# Patient Record
Sex: Female | Born: 1980 | Race: Black or African American | Hispanic: No | Marital: Single | Smoking: Former smoker
Health system: Southern US, Community
[De-identification: ages and names within clinical notes are randomized; demographics above are authoritative.]

## PROBLEM LIST (undated history)

## (undated) DIAGNOSIS — M1990 Unspecified osteoarthritis, unspecified site: Secondary | ICD-10-CM

## (undated) HISTORY — DX: Unspecified osteoarthritis, unspecified site: M19.90

## (undated) HISTORY — PX: TUBAL LIGATION: SHX77

---

## 2001-09-06 ENCOUNTER — Emergency Department (HOSPITAL_COMMUNITY): Admission: EM | Admit: 2001-09-06 | Discharge: 2001-09-06 | Payer: Self-pay | Admitting: Emergency Medicine

## 2002-08-19 ENCOUNTER — Emergency Department (HOSPITAL_COMMUNITY): Admission: EM | Admit: 2002-08-19 | Discharge: 2002-08-20 | Payer: Self-pay | Admitting: Emergency Medicine

## 2004-04-17 ENCOUNTER — Inpatient Hospital Stay (HOSPITAL_COMMUNITY): Admission: AD | Admit: 2004-04-17 | Discharge: 2004-04-19 | Payer: Self-pay | Admitting: Obstetrics and Gynecology

## 2005-10-22 ENCOUNTER — Emergency Department (HOSPITAL_COMMUNITY): Admission: EM | Admit: 2005-10-22 | Discharge: 2005-10-22 | Payer: Self-pay | Admitting: Emergency Medicine

## 2005-11-09 ENCOUNTER — Ambulatory Visit (HOSPITAL_COMMUNITY): Admission: RE | Admit: 2005-11-09 | Discharge: 2005-11-09 | Payer: Self-pay | Admitting: *Deleted

## 2006-04-02 ENCOUNTER — Inpatient Hospital Stay (HOSPITAL_COMMUNITY): Admission: AD | Admit: 2006-04-02 | Discharge: 2006-04-03 | Payer: Self-pay | Admitting: Obstetrics and Gynecology

## 2007-04-08 ENCOUNTER — Ambulatory Visit: Payer: Self-pay | Admitting: Family Medicine

## 2007-04-08 ENCOUNTER — Inpatient Hospital Stay (HOSPITAL_COMMUNITY): Admission: AD | Admit: 2007-04-08 | Discharge: 2007-04-09 | Payer: Self-pay | Admitting: Obstetrics and Gynecology

## 2007-05-16 ENCOUNTER — Ambulatory Visit (HOSPITAL_COMMUNITY): Admission: RE | Admit: 2007-05-16 | Discharge: 2007-05-16 | Payer: Self-pay | Admitting: Obstetrics & Gynecology

## 2007-10-16 ENCOUNTER — Other Ambulatory Visit: Admission: RE | Admit: 2007-10-16 | Discharge: 2007-10-16 | Payer: Self-pay | Admitting: Obstetrics & Gynecology

## 2008-10-17 ENCOUNTER — Other Ambulatory Visit: Admission: RE | Admit: 2008-10-17 | Discharge: 2008-10-17 | Payer: Self-pay | Admitting: Obstetrics & Gynecology

## 2010-04-13 ENCOUNTER — Other Ambulatory Visit: Admission: RE | Admit: 2010-04-13 | Discharge: 2010-04-13 | Payer: Self-pay | Admitting: Obstetrics & Gynecology

## 2010-09-05 HISTORY — PX: OTHER SURGICAL HISTORY: SHX169

## 2010-12-17 ENCOUNTER — Emergency Department (HOSPITAL_COMMUNITY)
Admission: EM | Admit: 2010-12-17 | Discharge: 2010-12-17 | Disposition: A | Discharge status: Home / Self Care | Payer: Medicaid Other | Attending: Emergency Medicine | Admitting: Emergency Medicine

## 2010-12-17 DIAGNOSIS — T148XXA Other injury of unspecified body region, initial encounter: Secondary | ICD-10-CM | POA: Insufficient documentation

## 2010-12-17 DIAGNOSIS — M545 Low back pain, unspecified: Secondary | ICD-10-CM | POA: Insufficient documentation

## 2010-12-17 DIAGNOSIS — Y929 Unspecified place or not applicable: Secondary | ICD-10-CM | POA: Insufficient documentation

## 2010-12-17 DIAGNOSIS — M25519 Pain in unspecified shoulder: Secondary | ICD-10-CM | POA: Insufficient documentation

## 2011-01-18 NOTE — Consult Note (Signed)
[name], Joy Harrington                  ACCOUNT NO.:  [id number]   MEDICAL RECORD NO.:  [id number]          PATIENT TYPE:  INP   LOCATION:  9170                          FACILITY:  [facility]   PHYSICIAN:  Tanya S. Shawnie Pons, M.D.   DATE OF BIRTH:  [birth year]   DATE OF CONSULTATION:  DATE OF DISCHARGE:                                 CONSULTATION   DELIVERY NOTE:  Immediately after receiving her epidural [name] felt an  urge to push. Sure enough, she was completely  dilated at +2 station.  After about 30 minute second stage [name] had a spontaneous vaginal  delivery of a viable female infant at 11:28.  There was a loose nuchal  cord which is very easily reduced.  The mouth and nose were suctioned  with a bulb syringe and the shoulders delivered without difficulty.  At  this point it was noted that the umbilical cord have avulsed from the  placenta prior to delivery.  There was a large amount of vaginal  bleeding.  However, I cannot be sure that some of it was not coming from  the umbilical cord. The umbilical cord was immediately clamped from the  baby and the baby taken to the warmer.  Weight 7 pounds 6 ounces, Apgars  are 9 and 9.  Twenty units of Pitocin diluted in 1000 mL of lactated  Ringer's was then infused rapidly IV.  The placenta had separated  spontaneously so I reached inside the vagina and grabbed hold of the  edge and it was delivered with maternal pushing effort and some manual  traction.  The placenta had already separated so this was not a manual  removal of the placenta.  The estimated blood loss was 500 mL.  The  placenta was inspected and appears to be intact.  Vagina was inspected  and is also intact.      [name], C.N.M.      Shelbie Proctor. Shawnie Pons, M.D.  Electronically Signed    FC/MEDQ  D:  04/08/2007  T:  04/08/2007  Job:  [id number]   cc:   [facility]   Francoise Schaumann. Milford Cage DO, FAAP  Fax: [phone]

## 2011-01-18 NOTE — Op Note (Signed)
NAMELORRETTA, KERCE                  ACCOUNT NO.:  [id number]   MEDICAL RECORD NO.:  [id number]          PATIENT TYPE:  AMB   LOCATION:  DAY                           FACILITY:  [facility]   PHYSICIAN:  Lazaro Arms, M.D.   DATE OF BIRTH:  22-Dec-1980   DATE OF PROCEDURE:  05/16/2007  DATE OF DISCHARGE:                               OPERATIVE REPORT   PREOPERATIVE DIAGNOSIS:  Multiparous female desires permanent  sterilization.   POSTOPERATIVE DIAGNOSIS:  Multiparous female desires permanent  sterilization.   PROCEDURE:  Laparoscopic bilateral tubal ligation.   SURGEON:  Lazaro Arms, M.D.   ANESTHESIA:  General endotracheal anesthesia.   FINDINGS:  The patient had normal uterus, tubes, and ovaries.  Normal  pelvic evaluation.   DESCRIPTION OF PROCEDURE:  The patient was taken to the operating room  and placed in the supine position where she underwent general  endotracheal anesthesia.  She was prepped and draped in the usual  sterile fashion.  A Hulka tenaculum was placed in the uterus for  manipulation.  An incision was made in the umbilicus.  The Veress needle  was placed.  Saline drop test was appropriate.  The peritoneal cavity  was insufflated.  A non-bladed 11 mm trocar was placed under direct  visualization without difficulty.  The fallopian tubes were identified.  They were burned using the electrocautery unit, a 2.5 cm segment  bilaterally.  There was good hemostasis.  The instruments were removed.  The gas was allowed to escape.  The fascia was closed with a single 0  Vicryl suture.  The skin was closed using skin staples.  The patient  tolerated the procedure well.  She experienced no blood loss.  She was  taken to the recovery room in good, stable condition.  All counts were  correct x3.      Lazaro Arms, M.D.  Electronically Signed     LHE/MEDQ  D:  05/16/2007  T:  05/16/2007  Job:  [id number]

## 2011-01-18 NOTE — H&P (Signed)
NAMECANDIA, KINGSBURY                  ACCOUNT NO.:  [id number]   MEDICAL RECORD NO.:  [id number]          PATIENT TYPE:  INP   LOCATION:  9133                          FACILITY:  [facility]   PHYSICIAN:  Tanya S. Shawnie Pons, M.D.   DATE OF BIRTH:  [birth year]   DATE OF ADMISSION:  04/08/2007  DATE OF DISCHARGE:  LH                              HISTORY & PHYSICAL   CHIEF COMPLAINT:  Spontaneous rupture of membranes at 0500, early labor.   HISTORY OF PRESENT ILLNESS:  French Ana is 30 year old gravida 5, para 2 AB  two living 2 with a EDC of 04/21/2007 placing her at [redacted] weeks gestation.  She began prenatal care in her first trimester and has had regular  visits since then. Lateefa has had an uneventful prenatal course.  Prenatal labs include blood type B+, rubella immune.  HB, SAG, HIV, HSV,  RPR, gonorrhea, Chlamydia and Group B strep are all normal.  1-hour GTT  was normal at 127.  Sickle cell was also negative.   PHYSICAL EXAM:  HEENT: Within normal limits.  Heart: Regular rate and  rhythm.  Lungs were clear.  Abdomen is soft and nontender.  She is  having mild uterine contractions every 3-5 minutes.  She is leaking  moderate amount of clear fluid.  Cervix is 5 cm, 9% effaced, minus one  station, vertex presentation.  Legs have trace edema.  Fetal heart rate  is reactive without V-cells   IMPRESSION:  IUP at term, spontaneous rupture of membranes, early labor.  Group B strep negative.   PLAN:  The patient does plan an epidural.  If her labor does not pick up  we will do Pitocin augmentation.      [name], C.N.M.      Shelbie Proctor. Shawnie Pons, M.D.  Electronically Signed    FC/MEDQ  D:  04/08/2007  T:  04/08/2007  Job:  [id number]   cc:   [facility]   Francoise Schaumann. Milford Cage DO, FAAP  Fax: [phone]

## 2011-01-21 NOTE — Op Note (Signed)
Joy Harrington, Joy Harrington                            ACCOUNT NO.:  [id number]   MEDICAL RECORD NO.:  [id number]                   PATIENT TYPE:  INP   LOCATION:  A428                                 FACILITY:  [facility]   PHYSICIAN:  Langley Gauss, M.D.                DATE OF BIRTH:  [birth year]   DATE OF PROCEDURE:  04/17/2004  DATE OF DISCHARGE:                                 OPERATIVE REPORT   The patient received prenatal care through the office of [facility].  Performed by Dr. Roylene Reason. Lisette Grinder. Estimated blood loss less than 500 cc.  Analgesia for delivery:  The patient received continuous lumbar epidural  analgesia per Dr. Roylene Reason. Carlson at time of delivery, a total of 30 cc of  1% lidocaine plain was injected in the midline of the perineal body.  Delivered is a 7-pound, 4-ounce female infant, deliver over a midline  episiotomy with repair. Total estimated blood loss less than 500 cc.   COMPLICATIONS:  None.   SPECIMENS:  Arterial cord gas and cord blood to pathology laboratory. The  placenta is examined and noted to be apparently intact with a 3 vessel  umbilical cord.   COMPLICATIONS OF DELIVERY:  Moderate variable decelerations during the  second stage of labor. The patient likewise was noted to have meconium  stained amniotic fluid which at time of delivery is noted to be gold in  color. Dr. __________ present at time of delivery.   SUMMARY:  The patient admitted early a.m. 8/13/05in apparent early active  labor. The patient initially received IV Nubain and Phenergan for obtained  relief; however, due to inadequate therapeutic response requested epidural  analgesia. Amniotomy was performed with findings of meconium-stained  amniotic fluid. Continuous lumbar epidural analgesia was placed.  Subsequently due to inadequate frequency and intensity of uterine  contractions, the patient did require Pitocin augmentation. With this  combination of the epidural and Pitocin, the  patient progressed normally  along the labor curve to complete dilatation. She was noted to experience  significant discomfort during the second stage and pushed very poorly. Thus,  she was treated with 5 cc of 2% lidocaine plain at which time she was  managed expectantly with the uterine contractions assisting. Subsequently  with onset of desire to push, the patient pushed well during the second  stage of labor with descent of the vertex to the +3 station, direct OA  position, and visible without separating the labile. She continued to have  the moderate variable decelerations during the second stage of labor. Thus,  when Dr. __________ arrived, the [employer] vacuum extractor was placed on the  neonate's vertex. Over the next two successive contractions keeping the  suction within the safe green range, very general traction combined  __________ resulted in very easy descent with distention of the perineal  body. Lidocaine was injected. Small midline episiotomy was performed. The  infant delivered over this midline episiotomy without extension. DeLee  suctioning was performed of the infant's mouth and nares on the perineum.  Three cc of the meconium stained amniotic fluid was contained in the DeLee  until clogging occurred. Thus, cord was doubly clamped and cut, and infant  extended to the waiting pediatrician, Dr. __________. The infant is noted to  have a spontaneous and vigorous breathing crying; thus visualization of the  vocal cords was not performed by Dr. __________. After obtaining arterial  cord gas and cord blood, gentle traction of the umbilical cord resulted in  separation which upon examination is noted to be an intact placenta with  associated three-vessel umbilical cord. Excellent uterine tone was achieved  following delivery of the placenta. Examination of the genital track reveals  no extension of episiotomy and suture repair utilizing 0 Chromic in a  running like fashion on the  vaginal mucosa followed by 2-0 closure of 0  Chromic on  the perineal body. Mother and infant doing very well following delivery. The  patient was taken on a dorsal lithotomy position and rolled to her side at  which time the epidural catheter was removed with the blue tip noted to be  intact.      ___________________________________________                                            Langley Gauss, M.D.   DC/MEDQ  D:  04/18/2004  T:  04/19/2004  Job:  [id number]

## 2011-01-21 NOTE — H&P (Signed)
[name], [name]                  ACCOUNT NO.:  [id number]   MEDICAL RECORD NO.:  [id number]          PATIENT TYPE:  INP   LOCATION:  LDR2                          FACILITY:  [facility]   PHYSICIAN:  Lazaro Arms, M.D.   DATE OF BIRTH:  04/04/1981   DATE OF ADMISSION:  04/02/2006  DATE OF DISCHARGE:  LH                                HISTORY & PHYSICAL   Joy Harrington is a 30 year old white female, gravida 4, para 1, abortus 2, estimated  date of delivery of April 07, 2006, by last menstrual period and 11-week  sonogram, currently 39-1/[redacted] weeks gestation, who presented to labor and  delivery complaining of regular uterine contractions.  She was found to be 4-  5 cm, 75% effaced, and -1 at the time of admission with intact bag of water  and having regular contractions. As a result, she is admitted for labor  management.   PAST MEDICAL HISTORY:  Negative.   PAST SURGICAL HISTORY:  Negative.   PAST OB HISTORY:  She had a vaginal delivery in August 2005.  She had an  ectopic x1 that was responded spontaneously, and a D&C in 1997 for a  miscarriage.   REVIEW OF SYSTEMS:  Otherwise negative.   LABORATORY DATA:  She is B positive, rubella immune, hepatitis B negative.  HIV was nonreactive, serologies nonreactive.  Pap was normal.  GC and  chlamydia were negative x2.  AFP was normal.  Group B strep was negative.  Glucola was 97.   PHYSICAL EXAMINATION:  HEENT:  Unremarkable.  NECK:.  Thyroid unremarkable.  LUNGS: Clear.  HEART: Regular rate and rhythm without regurgitation or gallop.  BREASTS: Deferred.  ABDOMEN: Fundal height of 40 cm.  PELVIC:  Cervix 4-5 at time of admission, 50%, and -1 station, vertex, soft,  posterior.   IMPRESSION:  1.  Intrauterine pregnancy at 39+ weeks gestation.  2.  Active phase of labor.   PLAN:  The patient will be managed expectantly for vaginal delivery.      Lazaro Arms, M.D.  Electronically Signed     LHE/MEDQ  D:  04/02/2006  T:  04/02/2006   Job:  [id number]

## 2011-01-21 NOTE — Op Note (Signed)
[name], Joy Harrington                            ACCOUNT NO.:  [id number]   MEDICAL RECORD NO.:  [id number]                   PATIENT TYPE:  INP   LOCATION:  LDR1                                 FACILITY:  [facility]   PHYSICIAN:  Langley Gauss, M.D.                DATE OF BIRTH:  May 09, 1981   DATE OF PROCEDURE:  04/17/2004  DATE OF DISCHARGE:                                 OPERATIVE REPORT   PROCEDURE:  Placement of continuous lumbar epidural analgesia at the L3/L4  interspace performed by Dr. Langley Gauss.   COMPLICATIONS:  None.   SPECIMENS:  None.   SUMMARY:  Appropriate informed consent was obtained. Continuous electronic  fetal monitoring was performed. The patient examined and noted to be 6 cm  dilated. Amniotomy revealed thin meconium-stained amniotic fluid. The  patient placed in a seated position. Bony landmarks were identified. The  L3/L4 interspace was chosen. The patient's back was sterilely prepped and  draped utilizing the epidural tray. Five cc of 1% lidocaine injected at the  midline of the L3/L4 interspace to raise a small skin wheal. The 17-gauge  Tuohy Schiff needle was then utilized with loss of resistance and air-filled  glass syringe. On the initial attempt, the bony spinous process was  encountered, but the epidural needle was directed slightly cephalic. On the  second attempt, there was excellent loss of resistance with entering the  epidural space. Initial test dose 5 cc 1.5% lidocaine plus epinephrine  injected into the epidural needle. No signs of CSF or intravascular  injection obtained. Thus, the epidural catheter inserted to a depth of 5 cm.  Needle was removed. Aspiration test was negative. Second test dose 2 cc 1.5%  lidocaine plus epinephrine injected via the epidural catheter. Again, no  signs of CSF or intravascular injection obtained. The patient was connected  to the infusion pump with the standard mixture. Ten cc bolus followed by  continuous  infusion rate of 14 cc/hour was administered. The patient was  returned to the lateral supine position at which time she does have evidence  of a bilateral block in place.      ___________________________________________                                            Langley Gauss, M.D.   DC/MEDQ  D:  04/17/2004  T:  04/18/2004  Job:  [id number]

## 2011-01-21 NOTE — H&P (Signed)
NAMEKMARI, BRIAN                            ACCOUNT NO.:  [id number]   MEDICAL RECORD NO.:  [id number]                   PATIENT TYPE:  INP   LOCATION:  LDR1                                 FACILITY:  [facility]   PHYSICIAN:  Langley Gauss, M.D.                DATE OF BIRTH:  [birth year]   DATE OF ADMISSION:  04/17/2004  DATE OF DISCHARGE:                                HISTORY & PHYSICAL   The patient is a 30 year old gravida 2, para 0, 38-6/7th weeks' gestation  who presents complaining of uterine contractions.  She is determined to be  in early stages of active labor.  The patient denied any rupture of  membranes, leakage of fluid, or any vaginal bleeding prior to presentation.   PRENATAL CARE:  She is followed through [facility].  Pertinently,  the patient is noted to have no prior history of chronic hypertension, no  prior treatment for hypertension.  She states that during the pregnancy she  did have some elevations of her blood pressure; however, review of her  records I was only able to see at one point in time where she had a  diastolic of 90.  No elevated systolics were identified.  In addition, she  was negative for protein on all previous visits.  The patient, also per  patient record, did test positive for HSV-1 antibody screening.  GBS  negative.  Glucose tolerance test within normal limits at 68.   PAST MEDICAL HISTORY:  One prior spontaneous AB in 1997.   No known drug allergies.   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   CURRENT MEDICATIONS:  Prenatal vitamins only.   PHYSICAL EXAMINATION:  GENERAL:  A morbidly obese black female.  VITAL SIGNS:  Pre-pregnancy weight 164 pounds, dated April 12, 2004, weight  is 223 pounds.  Blood pressure initial presentation 140/86, pulse of 84,  respiratory rate is 16, temperature 98.1.  HEENT:  Negative.  No adenopathy.  NECK:  Supple.  Thyroid is not palpable.  LUNGS:  Clear.  CARDIOVASCULAR:  Regular rate and  rhythm.  ABDOMEN:  Soft and nontender.  No surgical scars were identified.  She has a  vertex presentation by Thayer Ohm maneuvers with a fundal height of 38-cm.  EXTREMITIES:  Reveal 2+ pretibial edema.  This is no change from previous  visit, April 12, 2004.  PELVIC:  Normal external genitalia.  No lesions or ulcerations identified.  Intact membranes.  Cervix, initial examination by the nursing staff, 3- to -  4-cm dilated, 75% effaced, -1 station.  Reassuring fetal heart rate was  identified.  Cervical  examination, by me, 6-cm dilated, 0 station,  completely effaced, vertex well applied.   The patient subsequently received a single dose of IV Nubain and Phenergan  for pain relief.  External fetal monitor reveals reassuring fetal heart  rate.  A fetal scalp electrode is placed with  a resultant amniotomy, thin  meconium stained amniotic fluid is identified.  The patient does request  epidural analgesic which will be placed at this point.   REVIEW OF LABORATORY STUDIES:  Reveals hemoglobin 10.1, hematocrit 30.6,  with a white count of 6.3.  B positive blood type.  Clean catch urinalysis a  few epithelial cells, many bacteria, 30 mg/dL proteinuria, large hemoglobin  identified.   ASSESSMENT:  1. A 38-6/7th weeks' intrauterine pregnancy in active labor with cervical     change.  2. Light thin meconium stained amniotic fluid.  3. Blood pressure mildly elevated with __________  to 1+ proteinuria on a     clean catch urinalysis.   PLAN:  The patient's blood pressure to be monitored serially with placement  of Foley after starting the epidural.  We can have a catheterized specimen  for DIP stick and clean catch urinalysis to evaluate for proteinuria.  Would  expect blood pressure to essentially normalized after placement of the  epidural.     ___________________________________________                                         Langley Gauss, M.D.   DC/MEDQ  D:  04/17/2004  T:   04/17/2004  Job:  [id number]   cc:   [facility]

## 2011-01-21 NOTE — Op Note (Signed)
NAMEVIVECA, BECKSTROM                  ACCOUNT NO.:  [id number]   MEDICAL RECORD NO.:  [id number]          PATIENT TYPE:  INP   LOCATION:  A402                          FACILITY:  [facility]   PHYSICIAN:  Lazaro Arms, M.D.   DATE OF BIRTH:  1981/01/14   DATE OF PROCEDURE:  DATE OF DISCHARGE:                                 OPERATIVE REPORT   PROCEDURE:  Epidural placement.   SURGEON:  Lazaro Arms, M.D.   PROCEDURE IN DETAIL:  The patient is a 30 year old, gravida 4, para 2,  abortus 1 whose cervix is 6-7 cm, 50% effaced and -1, requesting an epidural  to be placed.  She has had a couple of doses of IV pain medicine.   She is placed in a sitting position .  Betadine prep is used.  One percent  Lidocaine is injected into the L3-L4 interspace.  A 17-gauge Tuohy needle  was used and loss of resistance technique employed and the epidural space is  wiped down with one pass without difficulty.  Ten cc of 0.125% bupivacaine  plain is given as a test dose and the epidural catheter is fed.  There are  no ill effects.  The additional 10 cc of 0.125% bupivacaine plain is given  through the epidural catheter which is taken down 5 cm into the epidural  space.  Continuous infusion of 0.125% bupivacaine plain with 2 mcg/cc of  fentanyl was done at 12 cc/hour.  The patient is getting comfort.  Blood  pressure is stable.  Fetal heart rate tracing is stable.      Lazaro Arms, M.D.  Electronically Signed     LHE/MEDQ  D:  04/02/2006  T:  04/03/2006  Job:  [id number]

## 2011-01-21 NOTE — Op Note (Signed)
Joy Harrington, Joy Harrington                  ACCOUNT NO.:  [id number]   MEDICAL RECORD NO.:  [id number]          PATIENT TYPE:  INP   LOCATION:  A402                          FACILITY:  [facility]   PHYSICIAN:  Lazaro Arms, M.D.   DATE OF BIRTH:  [birth year]   DATE OF PROCEDURE:  DATE OF DISCHARGE:                                 OPERATIVE REPORT   Joy Harrington is a 30 year old gravida 4 para 2 abortus 1, 39 1/[redacted] weeks gestation  who progressed slowly through the plateau phase of labor once her epidural  was placed.  Nonetheless, she was found to be complete with no urge to push.  She was allowed to rest for about an hour and the baby came down to about a  +2 station spontaneously.  She then pushed during two contractions and over  intact perineum delivered a viable female infant at 82 with Apgar's of 9 and  9.  Baby is yet to be weighed.  Baby underwent routine neonatal  resuscitation.  There was a three-vessel cord.  Cord blood and cord gas were  sent.  Placenta was delivered spontaneously intact without difficulty.  The  uterus contracted down nicely and was firm below the umbilicus.  The  perineum was intact.  She had a small periurethral laceration which did not  require repair.  Her blood loss with delivery was 250 cc's.  Patient will  undergo routine postpartum care.  The infant will undergo routine antepartum  care.      Lazaro Arms, M.D.  Electronically Signed     LHE/MEDQ  D:  04/02/2006  T:  04/03/2006  Job:  [id number]

## 2011-05-20 ENCOUNTER — Other Ambulatory Visit (HOSPITAL_COMMUNITY)
Admission: RE | Admit: 2011-05-20 | Discharge: 2011-05-20 | Disposition: A | Discharge status: Home / Self Care | Payer: Medicaid Other | Source: Ambulatory Visit | Attending: Obstetrics & Gynecology | Admitting: Obstetrics & Gynecology

## 2011-05-20 DIAGNOSIS — Z01419 Encounter for gynecological examination (general) (routine) without abnormal findings: Secondary | ICD-10-CM | POA: Insufficient documentation

## 2011-06-17 LAB — DIFFERENTIAL
Basophils Absolute: 0
Basophils Relative: 1
Eosinophils Absolute: 0.3
Eosinophils Relative: 5
Lymphs Abs: 2.5
Monocytes Absolute: 0.5
Monocytes Relative: 9

## 2011-06-17 LAB — URINALYSIS, ROUTINE W REFLEX MICROSCOPIC
Glucose, UA: NEGATIVE
Nitrite: NEGATIVE
Specific Gravity, Urine: 1.02
Urobilinogen, UA: 0.2

## 2011-06-17 LAB — CBC
HCT: 37.3
MCHC: 32.5
Platelets: 254
RDW: 19.1 — ABNORMAL HIGH
WBC: 6.1

## 2011-06-20 LAB — CBC
Hemoglobin: 10.7 — ABNORMAL LOW
MCHC: 33
MCV: 81.4
Platelets: 247
RBC: 3.98
RDW: 21.7 — ABNORMAL HIGH
WBC: 7.4

## 2012-05-22 ENCOUNTER — Other Ambulatory Visit (HOSPITAL_COMMUNITY)
Admission: RE | Admit: 2012-05-22 | Discharge: 2012-05-22 | Disposition: A | Discharge status: Home / Self Care | Payer: Medicaid Other | Source: Ambulatory Visit | Attending: Obstetrics & Gynecology | Admitting: Obstetrics & Gynecology

## 2012-05-22 DIAGNOSIS — Z01419 Encounter for gynecological examination (general) (routine) without abnormal findings: Secondary | ICD-10-CM | POA: Insufficient documentation

## 2013-05-30 ENCOUNTER — Other Ambulatory Visit (HOSPITAL_COMMUNITY)
Admission: RE | Admit: 2013-05-30 | Discharge: 2013-05-30 | Disposition: A | Discharge status: Home / Self Care | Payer: Medicaid Other | Source: Ambulatory Visit | Attending: Obstetrics & Gynecology | Admitting: Obstetrics & Gynecology

## 2013-05-30 ENCOUNTER — Ambulatory Visit (INDEPENDENT_AMBULATORY_CARE_PROVIDER_SITE_OTHER): Payer: Medicaid Other | Admitting: Obstetrics & Gynecology

## 2013-05-30 ENCOUNTER — Encounter: Payer: Self-pay | Admitting: Obstetrics & Gynecology

## 2013-05-30 VITALS — BP 120/80 | Ht 66.0 in | Wt 172.0 lb

## 2013-05-30 DIAGNOSIS — Z01419 Encounter for gynecological examination (general) (routine) without abnormal findings: Secondary | ICD-10-CM | POA: Insufficient documentation

## 2013-05-30 DIAGNOSIS — Z1151 Encounter for screening for human papillomavirus (HPV): Secondary | ICD-10-CM | POA: Insufficient documentation

## 2013-05-30 DIAGNOSIS — Z0000 Encounter for general adult medical examination without abnormal findings: Secondary | ICD-10-CM

## 2013-05-30 NOTE — Addendum Note (Signed)
Addended by: Criss Alvine on: 05/30/2013 09:37 AM   Modules accepted: Orders

## 2013-05-30 NOTE — Progress Notes (Signed)
Patient ID: Joy Harrington, female   DOB: [birth year], 32 y.o.   MRN: [id number] History reviewed. No pertinent past medical history.  Subjective:     Joy Harrington is a 32 y.o. female here for a routine exam.  Patient's last menstrual period was 05/06/2013. G4P3 Current complaints: none.  Personal health questionnaire reviewed: no.   Gynecologic History Patient's last menstrual period was 05/06/2013. Contraception: tubal ligation Last Pap: 2013. Results were: normal Last mammogram: na. Results were: na  Obstetric History OB History  Gravida Para Term Preterm AB SAB TAB Ectopic Multiple Living  4 3            # Outcome Date GA Lbr Len/2nd Weight Sex Delivery Anes PTL Lv  4 PAR           3 PAR           2 PAR           1 GRA                The following portions of the patient's history were reviewed and updated as appropriate: allergies, current medications, past family history, past medical history, past social history, past surgical history and problem list.  Review of Systems  Review of Systems  Constitutional: Negative for fever, chills, weight loss, malaise/fatigue and diaphoresis.  HENT: Negative for hearing loss, ear pain, nosebleeds, congestion, sore throat, neck pain, tinnitus and ear discharge.   Eyes: Negative for blurred vision, double vision, photophobia, pain, discharge and redness.  Respiratory: Negative for cough, hemoptysis, sputum production, shortness of breath, wheezing and stridor.   Cardiovascular: Negative for chest pain, palpitations, orthopnea, claudication, leg swelling and PND.  Gastrointestinal: negative for abdominal pain. Negative for heartburn, nausea, vomiting, diarrhea, constipation, blood in stool and melena.  Genitourinary: Negative for dysuria, urgency, frequency, hematuria and flank pain.  Musculoskeletal: Negative for myalgias, back pain, joint pain and falls.  Skin: Negative for itching and rash.  Neurological: Negative for dizziness,  tingling, tremors, sensory change, speech change, focal weakness, seizures, loss of consciousness, weakness and headaches.  Endo/Heme/Allergies: Negative for environmental allergies and polydipsia. Does not bruise/bleed easily.  Psychiatric/Behavioral: Negative for depression, suicidal ideas, hallucinations, memory loss and substance abuse. The patient is not nervous/anxious and does not have insomnia.        Objective:    Physical Exam  Vitals reviewed. Constitutional: She is oriented to person, place, and time. She appears well-developed and well-nourished.  HENT:  Head: Normocephalic and atraumatic.        Right Ear: External ear normal.  Left Ear: External ear normal.  Nose: Nose normal.  Mouth/Throat: Oropharynx is clear and moist.  Eyes: Conjunctivae and EOM are normal. Pupils are equal, round, and reactive to light. Right eye exhibits no discharge. Left eye exhibits no discharge. No scleral icterus.  Neck: Normal range of motion. Neck supple. No tracheal deviation present. No thyromegaly present.  Cardiovascular: Normal rate, regular rhythm, normal heart sounds and intact distal pulses.  Exam reveals no gallop and no friction rub.   No murmur heard. Respiratory: Effort normal and breath sounds normal. No respiratory distress. She has no wheezes. She has no rales. She exhibits no tenderness.  GI: Soft. Bowel sounds are normal. She exhibits no distension and no mass. There is no tenderness. There is no rebound and no guarding.  Genitourinary:  Breast no masses dischare or skin changes      Vulva is normal without lesions Vagina is pink moist  without discharge Cervix normal in appearance and pap is done Uterus is normal size shape and contour Adnexa is negative with normal sized ovaries   Musculoskeletal: Normal range of motion. She exhibits no edema and no tenderness.  Neurological: She is alert and oriented to person, place, and time. She has normal reflexes. She displays normal  reflexes. No cranial nerve deficit. She exhibits normal muscle tone. Coordination normal.  Skin: Skin is warm and dry. No rash noted. No erythema. No pallor.  Psychiatric: She has a normal mood and affect. Her behavior is normal. Judgment and thought content normal.       Assessment:    Healthy female exam.    Plan:    Follow up in: 1 year.     Past Surgical History  Procedure Laterality Date  . Lap btl  2012    OB History   Grav Para Term Preterm Abortions TAB SAB Ect Mult Living   4 3              History   Social History  . Marital Status: Single    Spouse Name: N/A    Number of Children: N/A  . Years of Education: N/A   Social History Main Topics  . Smoking status: Former Smoker    Quit date: 05/31/2007  . Smokeless tobacco: None  . Alcohol Use: None  . Drug Use: None  . Sexual Activity: Yes    Birth Control/ Protection: Surgical   Other Topics Concern  . None   Social History Narrative  . None    Family History  Problem Relation Age of Onset  . Hypertension Father   . Diabetes Father   . Hypertension Mother   . Diabetes Mother

## 2013-07-11 ENCOUNTER — Other Ambulatory Visit: Payer: Self-pay

## 2013-11-22 ENCOUNTER — Emergency Department (HOSPITAL_COMMUNITY)
Admission: EM | Admit: 2013-11-22 | Discharge: 2013-11-22 | Disposition: A | Discharge status: Home / Self Care | Payer: Medicaid Other | Attending: Emergency Medicine | Admitting: Emergency Medicine

## 2013-11-22 ENCOUNTER — Encounter (HOSPITAL_COMMUNITY): Payer: Self-pay | Admitting: Emergency Medicine

## 2013-11-22 DIAGNOSIS — L723 Sebaceous cyst: Secondary | ICD-10-CM | POA: Insufficient documentation

## 2013-11-22 DIAGNOSIS — Z87891 Personal history of nicotine dependence: Secondary | ICD-10-CM | POA: Insufficient documentation

## 2013-11-22 DIAGNOSIS — L0291 Cutaneous abscess, unspecified: Secondary | ICD-10-CM

## 2013-11-22 DIAGNOSIS — N61 Mastitis without abscess: Secondary | ICD-10-CM | POA: Insufficient documentation

## 2013-11-22 MED ORDER — HYDROCODONE-ACETAMINOPHEN 5-325 MG PO TABS: 2.0000 | ORAL_TABLET | ORAL | Status: DC | PRN

## 2013-11-22 MED ORDER — SULFAMETHOXAZOLE-TRIMETHOPRIM 800-160 MG PO TABS: 1.0000 | ORAL_TABLET | Freq: Two times a day (BID) | ORAL | Status: DC

## 2013-11-22 MED ORDER — LIDOCAINE HCL (PF) 1 % IJ SOLN
5.0000 mL | Freq: Once | INTRAMUSCULAR | Status: DC
  Filled 2013-11-22: qty 5

## 2013-11-22 NOTE — Care Management Note (Signed)
ED/CM noted patient did not have health insurance and/or PCP listed in the computer.  Patient was given the [place] resource handout with information on the clinics, food pantries, and the handout for new health insurance sign-up.  Patient expressed appreciation for information received. 

## 2013-11-22 NOTE — ED Notes (Signed)
Small boil (approx 1/2 inch in diameter) noted to outside of right aerola. No drainage.

## 2013-11-22 NOTE — ED Notes (Signed)
Pt c/o abscess to right breast x 1 week 

## 2013-11-22 NOTE — ED Provider Notes (Signed)
CSN: [id number]     Arrival date & time 11/22/13  0935 History   This chart was scribed for Gilda Creasehristopher J. Tandrea Kommer, * by Manuela Schwartzaylor Day, ED scribe. This patient was seen in room APA12/[facility]12 and the patient's care was started at 0935.  Chief Complaint  Patient presents with  . Abscess   The history is provided by the patient. No language interpreter was used.   HPI Comments: Joy Harrington is a 33 y.o. female who presents to the Emergency Department for a constant, gradually worsened, painful/swollen abscess to her right breast, ongoing for x1 week. She states the abscess involves the lower portion of her areola. She denies any drainage.She denies any previous similar episodes. She has no hx of DM.   History reviewed. No pertinent past medical history. Past Surgical History  Procedure Laterality Date  . Lap btl  2012   Family History  Problem Relation Age of Onset  . Hypertension Father   . Diabetes Father   . Hypertension Mother   . Diabetes Mother    History  Substance Use Topics  . Smoking status: Former Smoker    Quit date: 05/31/2007  . Smokeless tobacco: Not on file  . Alcohol Use: No   OB History   Grav Para Term Preterm Abortions TAB SAB Ect Mult Living   4 3             Review of Systems  Constitutional: Negative for fever and chills.  Respiratory: Negative for cough and shortness of breath.   Cardiovascular: Negative for chest pain.  Gastrointestinal: Negative for abdominal pain.  Musculoskeletal: Negative for back pain.  Skin: Positive for wound (abscess right breast).    Allergies  Review of patient's allergies indicates no known allergies.  Home Medications   Current Outpatient Rx  Name  Route  Sig  Dispense  Refill  . HYDROcodone-acetaminophen (NORCO/VICODIN) 5-325 MG per tablet   Oral   Take 2 tablets by mouth every 4 (four) hours as needed for moderate pain.   10 tablet   0   . sulfamethoxazole-trimethoprim (SEPTRA DS) 800-160 MG per tablet   Oral   Take 1 tablet by mouth every 12 (twelve) hours.   20 tablet   0     Triage Vitals: BP 151/56  Pulse 80  Temp(Src) 99.3 F (37.4 C)  Resp 20  Ht 5\' 7"  (1.702 m)  Wt 160 lb (72.576 kg)  BMI 25.05 kg/m2  SpO2 100%  LMP 10/20/2013  Physical Exam  Nursing note and vitals reviewed. Constitutional: She is oriented to person, place, and time. She appears well-developed and well-nourished. No distress.  HENT:  Head: Normocephalic and atraumatic.  Eyes: Conjunctivae are normal. Right eye exhibits no discharge. Left eye exhibits no discharge.  Neck: Normal range of motion.  Cardiovascular: Normal rate, regular rhythm and normal heart sounds.   Pulmonary/Chest: Effort normal. No respiratory distress.  Musculoskeletal: Normal range of motion. She exhibits no edema.  Neurological: She is alert and oriented to person, place, and time.  Skin: Skin is warm and dry.  1 1/2 cm pointing abscess on the inferior areola; right breast  Psychiatric: She has a normal mood and affect. Thought content normal.    ED Course  INCISION AND DRAINAGE Date/Time: 11/22/2013 3:33 PM Performed by: Gilda CreasePOLLINA, Rube Sanchez J. Authorized by: Gilda CreasePOLLINA, Akisha Sturgill J. Consent: Verbal consent obtained. Risks and benefits: risks, benefits and alternatives were discussed Consent given by: patient Patient understanding: patient states understanding of the procedure being  performed Patient consent: the patient's understanding of the procedure matches consent given Procedure consent: procedure consent matches procedure scheduled Relevant documents: relevant documents present and verified Test results: test results available and properly labeled Site marked: the operative site was marked Imaging studies: imaging studies available Patient identity confirmed: verbally with patient, arm band and hospital-assigned identification number Time out: Immediately prior to procedure a "time out" was called to verify the correct  patient, procedure, equipment, support staff and site/side marked as required. Type: abscess Body area: trunk Location details: right breast Anesthesia: local infiltration Local anesthetic: lidocaine 1% without epinephrine Anesthetic total: 3 ml Patient sedated: no Scalpel size: 11 Incision type: single straight Complexity: simple Drainage: purulent Drainage amount: moderate Packing material: 1/4 in iodoform gauze Patient tolerance: Patient tolerated the procedure well with no immediate complications.   (including critical care time) DIAGNOSTIC STUDIES: Oxygen Saturation is 100% on room air, normal by my interpretation.    COORDINATION OF CARE: At 1010 AM Discussed treatment plan with patient which includes I&D. Patient agrees.   Labs Review Labs Reviewed - No data to display Imaging Review No results found.   EKG Interpretation None      MDM   Final diagnoses:  Sebaceous cyst  Abscess   The patient presented with a tender nodule on the inferior portion of her right areola. There was a presumed abscess I recommended incision and drainage. Procedure was performed revealing a large amount of sebaceous material that was removed from the region. She will be empirically placed on antibiotics. She can remove the packing herself in 2 days, follow up as needed. She was informed that the area starts to reaccumulate, she wanted to see a dermatologist or a general surgeon for formal removal.  I personally performed the services described in this documentation, which was scribed in my presence. The recorded information has been reviewed and is accurate.      Gilda Crease, MD 11/22/13 1534

## 2013-11-22 NOTE — Discharge Instructions (Signed)
Abscess °An abscess is an infected area that contains a collection of pus and debris. It can occur in almost any part of the body. An abscess is also known as a furuncle or boil. °CAUSES  °An abscess occurs when tissue gets infected. This can occur from blockage of oil or sweat glands, infection of hair follicles, or a minor injury to the skin. As the body tries to fight the infection, pus collects in the area and creates pressure under the skin. This pressure causes pain. People with weakened immune systems have difficulty fighting infections and get certain abscesses more often.  °SYMPTOMS °Usually an abscess develops on the skin and becomes a painful mass that is red, warm, and tender. If the abscess forms under the skin, you may feel a moveable soft area under the skin. Some abscesses break open (rupture) on their own, but most will continue to get worse without care. The infection can spread deeper into the body and eventually into the bloodstream, causing you to feel ill.  °DIAGNOSIS  °Your caregiver will take your medical history and perform a physical exam. A sample of fluid may also be taken from the abscess to determine what is causing your infection. °TREATMENT  °Your caregiver may prescribe antibiotic medicines to fight the infection. However, taking antibiotics alone usually does not cure an abscess. Your caregiver may need to make a small cut (incision) in the abscess to drain the pus. In some cases, gauze is packed into the abscess to reduce pain and to continue draining the area. °HOME CARE INSTRUCTIONS  °· Only take over-the-counter or prescription medicines for pain, discomfort, or fever as directed by your caregiver. °· If you were prescribed antibiotics, take them as directed. Finish them even if you start to feel better. °· If gauze is used, follow your caregiver's directions for changing the gauze. °· To avoid spreading the infection: °· Keep your draining abscess covered with a  bandage. °· Wash your hands well. °· Do not share personal care items, towels, or whirlpools with others. °· Avoid skin contact with others. °· Keep your skin and clothes clean around the abscess. °· Keep all follow-up appointments as directed by your caregiver. °SEEK MEDICAL CARE IF:  °· You have increased pain, swelling, redness, fluid drainage, or bleeding. °· You have muscle aches, chills, or a general ill feeling. °· You have a fever. °MAKE SURE YOU:  °· Understand these instructions. °· Will watch your condition. °· Will get help right away if you are not doing well or get worse. °Document Released: 06/01/2005 Document Revised: 02/21/2012 Document Reviewed: 11/04/2011 °ExitCare® Patient Information ©2014 [employer]. ° °Abscess °Care After °An abscess (also called a boil or furuncle) is an infected area that contains a collection of pus. Signs and symptoms of an abscess include pain, tenderness, redness, or hardness, or you may feel a moveable soft area under your skin. An abscess can occur anywhere in the body. The infection may spread to surrounding tissues causing cellulitis. A cut (incision) by the surgeon was made over your abscess and the pus was drained out. Gauze may have been packed into the space to provide a drain that will allow the cavity to heal from the inside outwards. The boil may be painful for 5 to 7 days. Most people with a boil do not have high fevers. Your abscess, if seen early, may not have localized, and may not have been lanced. If not, another appointment may be required for this if it does   not get better on its own or with medications. °HOME CARE INSTRUCTIONS  °· Only take over-the-counter or prescription medicines for pain, discomfort, or fever as directed by your caregiver. °· When you bathe, soak and then remove gauze or iodoform packs at least daily or as directed by your caregiver. You may then wash the wound gently with mild soapy water. Repack with gauze or do as your  caregiver directs. °SEEK IMMEDIATE MEDICAL CARE IF:  °· You develop increased pain, swelling, redness, drainage, or bleeding in the wound site. °· You develop signs of generalized infection including muscle aches, chills, fever, or a general ill feeling. °· An oral temperature above 102° F (38.9° C) develops, not controlled by medication. °See your caregiver for a recheck if you develop any of the symptoms described above. If medications (antibiotics) were prescribed, take them as directed. °Document Released: 03/10/2005 Document Revised: 11/14/2011 Document Reviewed: 11/05/2007 °ExitCare® Patient Information ©2014 [employer]. ° °

## 2014-05-31 ENCOUNTER — Emergency Department (HOSPITAL_COMMUNITY): Payer: Medicaid Other

## 2014-05-31 ENCOUNTER — Encounter (HOSPITAL_COMMUNITY): Payer: Self-pay | Admitting: Emergency Medicine

## 2014-05-31 ENCOUNTER — Emergency Department (HOSPITAL_COMMUNITY)
Admission: EM | Admit: 2014-05-31 | Discharge: 2014-05-31 | Disposition: A | Discharge status: Home / Self Care | Payer: Medicaid Other | Attending: Emergency Medicine | Admitting: Emergency Medicine

## 2014-05-31 DIAGNOSIS — M5412 Radiculopathy, cervical region: Secondary | ICD-10-CM | POA: Diagnosis not present

## 2014-05-31 DIAGNOSIS — Z87891 Personal history of nicotine dependence: Secondary | ICD-10-CM | POA: Insufficient documentation

## 2014-05-31 DIAGNOSIS — M25519 Pain in unspecified shoulder: Secondary | ICD-10-CM | POA: Diagnosis present

## 2014-05-31 MED ORDER — CYCLOBENZAPRINE HCL 5 MG PO TABS: 5.0000 mg | ORAL_TABLET | Freq: Three times a day (TID) | ORAL | Status: DC | PRN

## 2014-05-31 MED ORDER — PREDNISONE 10 MG PO TABS: ORAL_TABLET | ORAL | Status: DC

## 2014-05-31 NOTE — ED Notes (Signed)
Patient awoke 3 mornings ago w/stinging, burning pain in R thumb which has progressively radiated to R shoulder and trapezius.  No known injury.  Has tried Tylenol and Aleve w/out resolution.

## 2014-05-31 NOTE — Discharge Instructions (Signed)
Cervical Radiculopathy Cervical radiculopathy means a nerve in the neck is pinched or bruised. This can cause pain or loss of feeling (numbness) that runs from your neck to your arm and fingers. HOME CARE   Apply a heating pad to neck and shoulder area for 20 minutes followed by gentle stretching exercises as demonstrated.  You may try a gentle neck and shoulder massage.  Use a flat pillow when you sleep.  Only take medicines as told by your doctor. GET HELP RIGHT AWAY IF:   Your pain gets worse and is not controlled with medicine.  You lose feeling or feel weak in your hand, arm, face, or leg.  You have a fever or stiff neck.  You cannot control when you poop or pee (incontinence).  You have trouble with walking, balance, or speaking. MAKE SURE YOU:   Understand these instructions.  Will watch your condition.  Will get help right away if you are not doing well or get worse. Document Released: 08/11/2011 Document Revised: 11/14/2011 Document Reviewed: 08/11/2011 [facility] Patient Information 2015 [place], [place]. This information is not intended to replace advice given to you by your health care provider. Make sure you discuss any questions you have with your health care provider.

## 2014-05-31 NOTE — ED Notes (Signed)
Patient with no complaints at this time. Respirations even and unlabored. Skin warm/dry. Discharge instructions reviewed with patient at this time. Patient given opportunity to voice concerns/ask questions. Patient discharged at this time and left Emergency Department with steady gait.   

## 2014-05-31 NOTE — ED Notes (Signed)
Patient states she has right shoulder pain that started 2 days ago. Described as a shooting pain that goes down her right arm down to her thumb. Worse with movement.

## 2014-05-31 NOTE — ED Provider Notes (Signed)
CSN: [id number]     Arrival date & time 05/31/14  1344 History   First MD Initiated Contact with Patient 05/31/14 1405     Chief Complaint  Patient presents with  . Shoulder Pain     (Consider location/radiation/quality/duration/timing/severity/associated sxs/prior Treatment) Patient is a 33 y.o. female presenting with shoulder pain. The history is provided by the patient.  Shoulder Pain Associated symptoms include arthralgias, myalgias and neck pain. Pertinent negatives include no fever, numbness or weakness.  Joy Harrington is a 33 y.o.right handed female presenting with pain in her right shoulder which she woke with 3 morning ago and is associated with intermittent shooting stinging pain into the right thumb.  She reports right shoulder and neck soreness which worsens with head and neck rotation. She denies injury but does report carrying a heavy backpack over the right shoulder since she returned to school this month.  She has taken no alleviators for her pain.  She denies weakness in her upper extremities.  Past medical history is noncontributory.     History reviewed. No pertinent past medical history. Past Surgical History  Procedure Laterality Date  . Lap btl  2012   Family History  Problem Relation Age of Onset  . Hypertension Father   . Diabetes Father   . Hypertension Mother   . Diabetes Mother    History  Substance Use Topics  . Smoking status: Former Smoker    Quit date: 05/31/2007  . Smokeless tobacco: Not on file  . Alcohol Use: No   OB History   Grav Para Term Preterm Abortions TAB SAB Ect Mult Living   4 3             Review of Systems  Constitutional: Negative for fever.  Musculoskeletal: Positive for arthralgias, myalgias and neck pain.  Neurological: Negative for weakness and numbness.      Allergies  Review of patient's allergies indicates no known allergies.  Home Medications   Prior to Admission medications   Medication Sig Start Date End  Date Taking? Authorizing Provider  cyclobenzaprine (FLEXERIL) 5 MG tablet Take 1 tablet (5 mg total) by mouth 3 (three) times daily as needed for muscle spasms. 05/31/14   Burgess Amor, PA-C  predniSONE (DELTASONE) 10 MG tablet 6, 5, 4, 3, 2 then 1 tablet by mouth daily for 6 days total. 05/31/14   Burgess Amor, PA-C   BP 111/45  Pulse 85  Temp(Src) 99.4 F (37.4 C) (Oral)  Resp 16  Ht  (1.702 m)  Wt 165 lb (74.844 kg)  BMI 25.84 kg/m2  LMP 05/06/2014 Physical Exam  Constitutional: She appears well-developed and well-nourished.  HENT:  Head: Atraumatic.  Neck: Normal range of motion.  Cardiovascular:  Pulses equal bilaterally  Musculoskeletal:       Cervical back: She exhibits tenderness and spasm. She exhibits no bony tenderness, no edema and no deformity.  TTP with right shoulder trapezius muscle spasm/pain  Midline c spine nontender, no deformity.    Neurological: She is alert. She has normal strength. She displays normal reflexes. No sensory deficit.  Reflex Scores:      Bicep reflexes are 2+ on the right side and 2+ on the left side. Equal grip strength.  Skin: Skin is warm and dry.  Psychiatric: She has a normal mood and affect.    ED Course  Procedures (including critical care time) Labs Review Labs Reviewed - No data to display  Imaging Review Dg Cervical Spine Complete  05/31/2014  CLINICAL DATA:  Right shoulder pain for 2 days. Neck stiffness. No known injury.  EXAM: CERVICAL SPINE  4+ VIEWS  COMPARISON:  None.  FINDINGS: There is no evidence of cervical spine fracture or prevertebral soft tissue swelling. Alignment is normal. No other significant bone abnormalities are identified.  IMPRESSION: Negative cervical spine radiographs.   Electronically Signed   By: Rosalie Gums M.D.   On: 05/31/2014 16:21     EKG Interpretation None      MDM   Final diagnoses:  Cervical radiculopathy    Patients labs and/or radiological studies were viewed and considered during  the medical decision making and disposition process. Pt was placed on prednisone taper,  Flexeril, encouraged heat tx, followed by ROM of neck, demonstrated for pt.  Encouraged recheck if not improved, referrals given for pcp.  Avoid carrying backpack on one shoulder, advised a wheeled bag would be healthier.    No neuro deficit on exam or by history to suggest emergent or surgical presentation.  Also discussed worsened sx that should prompt immediate re-evaluation including distal weakness or numbness.         Burgess Amor, PA-C 05/31/14 1703

## 2014-06-01 NOTE — ED Provider Notes (Signed)
Medical screening examination/treatment/procedure(s) were performed by non-physician practitioner and as supervising physician I was immediately available for consultation/collaboration.   EKG Interpretation None      Devoria Albe, MD, Armando Gang   Ward Givens, MD 06/01/14 [phone]

## 2014-06-03 ENCOUNTER — Encounter: Payer: Self-pay | Admitting: Obstetrics & Gynecology

## 2014-06-03 ENCOUNTER — Ambulatory Visit (INDEPENDENT_AMBULATORY_CARE_PROVIDER_SITE_OTHER): Payer: Medicaid Other | Admitting: Obstetrics & Gynecology

## 2014-06-03 ENCOUNTER — Other Ambulatory Visit (HOSPITAL_COMMUNITY)
Admission: RE | Admit: 2014-06-03 | Discharge: 2014-06-03 | Disposition: A | Discharge status: Home / Self Care | Payer: Medicaid Other | Source: Ambulatory Visit | Attending: Obstetrics & Gynecology | Admitting: Obstetrics & Gynecology

## 2014-06-03 VITALS — BP 110/60 | Ht 66.75 in | Wt 175.0 lb

## 2014-06-03 DIAGNOSIS — Z01419 Encounter for gynecological examination (general) (routine) without abnormal findings: Secondary | ICD-10-CM | POA: Diagnosis not present

## 2014-06-03 DIAGNOSIS — Z0000 Encounter for general adult medical examination without abnormal findings: Secondary | ICD-10-CM

## 2014-06-03 NOTE — Progress Notes (Signed)
Patient ID: Joy Harrington, female   DOB: 09/08/1980, 33 y.o.   MRN: [id number] Subjective:     Joy Harrington is a 33 y.o. female here for a routine exam.  Patient's last menstrual period was 05/06/2014. G4P3 Birth Control Method:  BTL Menstrual Calendar(currently): LMP 05/06/2014  Current complaints: None.   Current acute medical issues:  None   Recent Gynecologic History Patient's last menstrual period was 05/06/2014. Last Pap: 1 year ago,  normal Last mammogram: N/A  History reviewed. No pertinent past medical history.  Past Surgical History  Procedure Laterality Date  . Lap btl  2012  . Tubal ligation      OB History   Grav Para Term Preterm Abortions TAB SAB Ect Mult Living   4 3              History   Social History  . Marital Status: Single    Spouse Name: N/A    Number of Children: N/A  . Years of Education: N/A   Social History Main Topics  . Smoking status: Former Smoker    Quit date: 05/31/2007  . Smokeless tobacco: Never Used  . Alcohol Use: No  . Drug Use: No  . Sexual Activity: Yes    Birth Control/ Protection: Surgical   Other Topics Concern  . None   Social History Narrative  . None    Family History  Problem Relation Age of Onset  . Hypertension Father   . Diabetes Father   . Hypertension Mother   . Diabetes Mother      Review of Systems  Review of Systems  Constitutional: Negative for fever, chills, weight loss, malaise/fatigue and diaphoresis.  HENT: Negative for hearing loss, ear pain, nosebleeds, congestion, sore throat, neck pain, tinnitus and ear discharge.   Eyes: Negative for blurred vision, double vision, photophobia, pain, discharge and redness.  Respiratory: Negative for cough, hemoptysis, sputum production, shortness of breath, wheezing and stridor.   Cardiovascular: Negative for chest pain, palpitations, orthopnea, claudication, leg swelling and PND.  Gastrointestinal: negative for abdominal pain. Negative for heartburn,  nausea, vomiting, diarrhea, constipation, blood in stool and melena.  Genitourinary: Negative for dysuria, urgency, frequency, hematuria and flank pain.  Musculoskeletal: Negative for myalgias, back pain, joint pain and falls.  Skin: Negative for itching and rash.  Neurological: Negative for dizziness, tingling, tremors, sensory change, speech change, focal weakness, seizures, loss of consciousness, weakness and headaches.  Endo/Heme/Allergies: Negative for environmental allergies and polydipsia. Does not bruise/bleed easily.  Psychiatric/Behavioral: Negative for depression, suicidal ideas, hallucinations, memory loss and substance abuse. The patient is not nervous/anxious and does not have insomnia.        Objective:    Physical Exam  Vitals reviewed. Constitutional: She is oriented to person, place, and time. She appears well-developed and well-nourished.  HENT:  Head: Normocephalic and atraumatic.        Right Ear: External ear normal.  Left Ear: External ear normal.  Nose: Nose normal.  Mouth/Throat: Oropharynx is clear and moist.  Eyes: Conjunctivae and EOM are normal. Pupils are equal, round, and reactive to light. Right eye exhibits no discharge. Left eye exhibits no discharge. No scleral icterus.  Neck: Normal range of motion. Neck supple. No tracheal deviation present. No thyromegaly present.  Cardiovascular: Normal rate, regular rhythm, normal heart sounds and intact distal pulses.  Exam reveals no gallop and no friction rub.   No murmur heard. Respiratory: Effort normal and breath sounds normal. No respiratory distress. She  has no wheezes. She has no rales. She exhibits no tenderness.  GI: Soft. Bowel sounds are normal. She exhibits no distension and no mass. There is no tenderness. There is no rebound and no guarding.  Genitourinary:  Breasts no masses skin changes or nipple changes bilaterally      Vulva is normal without lesions Vagina is pink moist without  discharge Cervix normal in appearance and pap is done Uterus is normal size shape and contour Adnexa is negative with normal sized ovaries  Rectal: normal tone, no masses  Musculoskeletal: Normal range of motion. She exhibits no edema and no tenderness.  Neurological: She is alert and oriented to person, place, and time. She has normal reflexes. She displays normal reflexes. No cranial nerve deficit. She exhibits normal muscle tone. Coordination normal.  Skin: Skin is warm and dry. No rash noted. No erythema. No pallor.  Psychiatric: She has a normal mood and affect. Her behavior is normal. Judgment and thought content normal.       Assessment:    Healthy female exam.    Plan:    Education reviewed: safe sex/STD prevention and self breast exams. Follow up pap smear results.

## 2014-06-04 LAB — CYTOLOGY - PAP

## 2014-06-20 ENCOUNTER — Other Ambulatory Visit: Payer: Self-pay

## 2014-06-23 ENCOUNTER — Telehealth: Payer: Self-pay | Admitting: Obstetrics & Gynecology

## 2014-06-23 NOTE — Telephone Encounter (Signed)
Pt informed of WNL pap results from 06/03/2014.

## 2014-07-07 ENCOUNTER — Encounter: Payer: Self-pay | Admitting: Obstetrics & Gynecology

## 2015-06-04 ENCOUNTER — Encounter: Payer: Self-pay | Admitting: Obstetrics & Gynecology

## 2015-06-04 ENCOUNTER — Ambulatory Visit (INDEPENDENT_AMBULATORY_CARE_PROVIDER_SITE_OTHER): Payer: Medicaid Other | Admitting: Obstetrics & Gynecology

## 2015-06-04 ENCOUNTER — Other Ambulatory Visit (HOSPITAL_COMMUNITY)
Admission: RE | Admit: 2015-06-04 | Discharge: 2015-06-04 | Disposition: A | Discharge status: Home / Self Care | Payer: Medicaid Other | Source: Ambulatory Visit | Attending: Obstetrics & Gynecology | Admitting: Obstetrics & Gynecology

## 2015-06-04 VITALS — BP 110/70 | HR 72 | Ht 67.0 in | Wt 160.0 lb

## 2015-06-04 DIAGNOSIS — Z0000 Encounter for general adult medical examination without abnormal findings: Secondary | ICD-10-CM

## 2015-06-04 DIAGNOSIS — Z01419 Encounter for gynecological examination (general) (routine) without abnormal findings: Secondary | ICD-10-CM | POA: Diagnosis not present

## 2015-06-04 NOTE — Addendum Note (Signed)
Addended by: Federico Flake A on: 06/04/2015 10:44 AM   Modules accepted: Orders

## 2015-06-04 NOTE — Progress Notes (Signed)
Patient ID: [name], female   DOB: [birth year], 34 y.o.   MRN: [id number] Subjective:     Joy Harrington is a 34 y.o. female here for a routine exam.  Patient's last menstrual period was 05/09/2015. G4P3 Birth Control Method:  Tubal ligation Menstrual Calendar(currently): regular  Current complaints: none.   Current acute medical issues:  none   Recent Gynecologic History Patient's last menstrual period was 05/09/2015. Last Pap: 2015,  normal Last mammogram: ,    History reviewed. No pertinent past medical history.  Past Surgical History  Procedure Laterality Date  . Lap btl  2012  . Tubal ligation      OB History    Gravida Para Term Preterm AB TAB SAB Ectopic Multiple Living   4 3              Social History   Social History  . Marital Status: Single    Spouse Name: N/A  . Number of Children: N/A  . Years of Education: N/A   Social History Main Topics  . Smoking status: Former Smoker    Quit date: 05/31/2007  . Smokeless tobacco: Never Used  . Alcohol Use: No  . Drug Use: No  . Sexual Activity: Yes    Birth Control/ Protection: Surgical   Other Topics Concern  . None   Social History Narrative    Family History  Problem Relation Age of Onset  . Hypertension Father   . Diabetes Father   . Hypertension Mother   . Diabetes Mother      Current outpatient prescriptions:  .  cyclobenzaprine (FLEXERIL) 5 MG tablet, Take 1 tablet (5 mg total) by mouth 3 (three) times daily as needed for muscle spasms. (Patient not taking: Reported on 06/04/2015), Disp: 30 tablet, Rfl: 0 .  predniSONE (DELTASONE) 10 MG tablet, 6, 5, 4, 3, 2 then 1 tablet by mouth daily for 6 days total. (Patient not taking: Reported on 06/04/2015), Disp: 21 tablet, Rfl: 0  Review of Systems  Review of Systems  Constitutional: Negative for fever, chills, weight loss, malaise/fatigue and diaphoresis.  HENT: Negative for hearing loss, ear pain, nosebleeds, congestion, sore throat, neck pain,  tinnitus and ear discharge.   Eyes: Negative for blurred vision, double vision, photophobia, pain, discharge and redness.  Respiratory: Negative for cough, hemoptysis, sputum production, shortness of breath, wheezing and stridor.   Cardiovascular: Negative for chest pain, palpitations, orthopnea, claudication, leg swelling and PND.  Gastrointestinal: negative for abdominal pain. Negative for heartburn, nausea, vomiting, diarrhea, constipation, blood in stool and melena.  Genitourinary: Negative for dysuria, urgency, frequency, hematuria and flank pain.  Musculoskeletal: Negative for myalgias, back pain, joint pain and falls.  Skin: Negative for itching and rash.  Neurological: Negative for dizziness, tingling, tremors, sensory change, speech change, focal weakness, seizures, loss of consciousness, weakness and headaches.  Endo/Heme/Allergies: Negative for environmental allergies and polydipsia. Does not bruise/bleed easily.  Psychiatric/Behavioral: Negative for depression, suicidal ideas, hallucinations, memory loss and substance abuse. The patient is not nervous/anxious and does not have insomnia.        Objective:  Blood pressure 110/70, pulse 72, height  (1.702 m), weight 160 lb (72.576 kg), last menstrual period 05/09/2015.   Physical Exam  Vitals reviewed. Constitutional: She is oriented to person, place, and time. She appears well-developed and well-nourished.  HENT:  Head: Normocephalic and atraumatic.        Right Ear: External ear normal.  Left Ear: External ear normal.  Nose:  Nose normal.  Mouth/Throat: Oropharynx is clear and moist.  Eyes: Conjunctivae and EOM are normal. Pupils are equal, round, and reactive to light. Right eye exhibits no discharge. Left eye exhibits no discharge. No scleral icterus.  Neck: Normal range of motion. Neck supple. No tracheal deviation present. No thyromegaly present.  Cardiovascular: Normal rate, regular rhythm, normal heart sounds and  intact distal pulses.  Exam reveals no gallop and no friction rub.   No murmur heard. Respiratory: Effort normal and breath sounds normal. No respiratory distress. She has no wheezes. She has no rales. She exhibits no tenderness.  GI: Soft. Bowel sounds are normal. She exhibits no distension and no mass. There is no tenderness. There is no rebound and no guarding.  Genitourinary:  Breasts no masses skin changes or nipple changes bilaterally      Vulva is normal without lesions Vagina is pink moist without discharge Cervix normal in appearance and pap is done Uterus is normal size shape and contour Adnexa is negative with normal sized ovaries   Musculoskeletal: Normal range of motion. She exhibits no edema and no tenderness.  Neurological: She is alert and oriented to person, place, and time. She has normal reflexes. She displays normal reflexes. No cranial nerve deficit. She exhibits normal muscle tone. Coordination normal.  Skin: Skin is warm and dry. No rash noted. No erythema. No pallor.  Psychiatric: She has a normal mood and affect. Her behavior is normal. Judgment and thought content normal.       Assessment:    Healthy female exam.    Plan:    Contraception: tubal ligation. Follow up in: 1 year.

## 2015-06-05 LAB — CYTOLOGY - PAP

## 2015-07-08 ENCOUNTER — Emergency Department (HOSPITAL_COMMUNITY)
Admission: EM | Admit: 2015-07-08 | Discharge: 2015-07-08 | Disposition: A | Discharge status: Home / Self Care | Payer: Medicaid Other | Attending: Emergency Medicine | Admitting: Emergency Medicine

## 2015-07-08 ENCOUNTER — Emergency Department (HOSPITAL_COMMUNITY): Payer: Medicaid Other

## 2015-07-08 ENCOUNTER — Encounter (HOSPITAL_COMMUNITY): Payer: Self-pay | Admitting: *Deleted

## 2015-07-08 DIAGNOSIS — Y998 Other external cause status: Secondary | ICD-10-CM | POA: Insufficient documentation

## 2015-07-08 DIAGNOSIS — Y92410 Unspecified street and highway as the place of occurrence of the external cause: Secondary | ICD-10-CM | POA: Diagnosis not present

## 2015-07-08 DIAGNOSIS — Y9389 Activity, other specified: Secondary | ICD-10-CM | POA: Diagnosis not present

## 2015-07-08 DIAGNOSIS — Z87891 Personal history of nicotine dependence: Secondary | ICD-10-CM | POA: Insufficient documentation

## 2015-07-08 DIAGNOSIS — S4992XA Unspecified injury of left shoulder and upper arm, initial encounter: Secondary | ICD-10-CM | POA: Diagnosis present

## 2015-07-08 DIAGNOSIS — M25562 Pain in left knee: Secondary | ICD-10-CM

## 2015-07-08 DIAGNOSIS — S8992XA Unspecified injury of left lower leg, initial encounter: Secondary | ICD-10-CM | POA: Diagnosis not present

## 2015-07-08 DIAGNOSIS — S46912A Strain of unspecified muscle, fascia and tendon at shoulder and upper arm level, left arm, initial encounter: Secondary | ICD-10-CM | POA: Insufficient documentation

## 2015-07-08 MED ORDER — CYCLOBENZAPRINE HCL 5 MG PO TABS: 5.0000 mg | ORAL_TABLET | Freq: Two times a day (BID) | ORAL | Status: DC | PRN
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
## 2015-07-08 NOTE — ED Notes (Signed)
Pt made aware to return if symptoms worsen or if any life threatening symptoms occur.   
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
## 2016-06-09 ENCOUNTER — Other Ambulatory Visit: Payer: Medicaid Other | Admitting: Obstetrics & Gynecology

## 2016-06-16 ENCOUNTER — Other Ambulatory Visit (HOSPITAL_COMMUNITY)
Admission: RE | Admit: 2016-06-16 | Discharge: 2016-06-16 | Disposition: A | Discharge status: Home / Self Care | Payer: Medicaid Other | Source: Ambulatory Visit | Attending: Obstetrics & Gynecology | Admitting: Obstetrics & Gynecology

## 2016-06-16 ENCOUNTER — Encounter: Payer: Self-pay | Admitting: Obstetrics & Gynecology

## 2016-06-16 ENCOUNTER — Ambulatory Visit (INDEPENDENT_AMBULATORY_CARE_PROVIDER_SITE_OTHER): Payer: Medicaid Other | Admitting: Obstetrics & Gynecology

## 2016-06-16 VITALS — BP 102/76 | HR 74 | Ht 67.0 in | Wt 171.0 lb

## 2016-06-16 DIAGNOSIS — Z01419 Encounter for gynecological examination (general) (routine) without abnormal findings: Secondary | ICD-10-CM | POA: Diagnosis present

## 2016-06-16 DIAGNOSIS — Z0000 Encounter for general adult medical examination without abnormal findings: Secondary | ICD-10-CM | POA: Diagnosis not present

## 2016-06-16 DIAGNOSIS — Z1151 Encounter for screening for human papillomavirus (HPV): Secondary | ICD-10-CM | POA: Diagnosis not present

## 2016-06-16 MED ORDER — IBUPROFEN 800 MG PO TABS: 800.0000 mg | ORAL_TABLET | Freq: Three times a day (TID) | ORAL | 3 refills | Status: DC | PRN

## 2016-06-16 NOTE — Progress Notes (Signed)
Subjective:     Joy Harrington is a 35 y.o. female here for a routine exam.  Patient's last menstrual period was 05/20/2016. G4P3 Birth Control Method:  Tubal ligation Menstrual Calendar(currently): regular  Current complaints: dysmenorrhea.   Current acute medical issues:  none   Recent Gynecologic History Patient's last menstrual period was 05/20/2016. Last Pap: 2016,  normal Last mammogram: ,    History reviewed. No pertinent past medical history.  Past Surgical History:  Procedure Laterality Date  . lap btl  2012  . TUBAL LIGATION      OB History    Gravida Para Term Preterm AB Living   4 3           SAB TAB Ectopic Multiple Live Births                  Social History   Social History  . Marital status: Single    Spouse name: N/A  . Number of children: N/A  . Years of education: N/A   Social History Main Topics  . Smoking status: Former Smoker    Quit date: 05/31/2007  . Smokeless tobacco: Never Used  . Alcohol use No  . Drug use: No  . Sexual activity: Yes    Partners: Male    Birth control/ protection: Surgical     Comment: tubal ligation   Other Topics Concern  . None   Social History Narrative  . None    Family History  Problem Relation Age of Onset  . Hypertension Father   . Diabetes Father   . Hypertension Mother   . Diabetes Mother      Current Outpatient Prescriptions:  .  acetaminophen (TYLENOL) 500 MG tablet, Take 1,000 mg by mouth every 6 (six) hours as needed., Disp: , Rfl:  .  cyclobenzaprine (FLEXERIL) 5 MG tablet, Take 1 tablet (5 mg total) by mouth 2 (two) times daily as needed for muscle spasms., Disp: 15 tablet, Rfl: 0 .  ibuprofen (ADVIL,MOTRIN) 800 MG tablet, Take 1 tablet (800 mg total) by mouth every 8 (eight) hours as needed., Disp: 30 tablet, Rfl: 3  Review of Systems  Review of Systems  Constitutional: Negative for fever, chills, weight loss, malaise/fatigue and diaphoresis.  HENT: Negative for hearing loss, ear pain,  nosebleeds, congestion, sore throat, neck pain, tinnitus and ear discharge.   Eyes: Negative for blurred vision, double vision, photophobia, pain, discharge and redness.  Respiratory: Negative for cough, hemoptysis, sputum production, shortness of breath, wheezing and stridor.   Cardiovascular: Negative for chest pain, palpitations, orthopnea, claudication, leg swelling and PND.  Gastrointestinal: negative for abdominal pain. Negative for heartburn, nausea, vomiting, diarrhea, constipation, blood in stool and melena.  Genitourinary: Negative for dysuria, urgency, frequency, hematuria and flank pain.  Musculoskeletal: Negative for myalgias, back pain, joint pain and falls.  Skin: Negative for itching and rash.  Neurological: Negative for dizziness, tingling, tremors, sensory change, speech change, focal weakness, seizures, loss of consciousness, weakness and headaches.  Endo/Heme/Allergies: Negative for environmental allergies and polydipsia. Does not bruise/bleed easily.  Psychiatric/Behavioral: Negative for depression, suicidal ideas, hallucinations, memory loss and substance abuse. The patient is not nervous/anxious and does not have insomnia.        Objective:  Blood pressure 102/76, pulse 74, height 5\' 7"  (1.702 m), weight 171 lb (77.6 kg), last menstrual period 05/20/2016.   Physical Exam  Vitals reviewed. Constitutional: She is oriented to person, place, and time. She appears well-developed and well-nourished.  HENT:  Head: Normocephalic and atraumatic.        Right Ear: External ear normal.  Left Ear: External ear normal.  Nose: Nose normal.  Mouth/Throat: Oropharynx is clear and moist.  Eyes: Conjunctivae and EOM are normal. Pupils are equal, round, and reactive to light. Right eye exhibits no discharge. Left eye exhibits no discharge. No scleral icterus.  Neck: Normal range of motion. Neck supple. No tracheal deviation present. No thyromegaly present.  Cardiovascular: Normal rate,  regular rhythm, normal heart sounds and intact distal pulses.  Exam reveals no gallop and no friction rub.   No murmur heard. Respiratory: Effort normal and breath sounds normal. No respiratory distress. She has no wheezes. She has no rales. She exhibits no tenderness.  GI: Soft. Bowel sounds are normal. She exhibits no distension and no mass. There is no tenderness. There is no rebound and no guarding.  Genitourinary:  Breasts no masses skin changes or nipple changes bilaterally      Vulva is normal without lesions Vagina is pink moist without discharge Cervix normal in appearance and pap is done Uterus is normal size shape and contour Adnexa is negative with normal sized ovaries   Musculoskeletal: Normal range of motion. She exhibits no edema and no tenderness.  Neurological: She is alert and oriented to person, place, and time. She has normal reflexes. She displays normal reflexes. No cranial nerve deficit. She exhibits normal muscle tone. Coordination normal.  Skin: Skin is warm and dry. No rash noted. No erythema. No pallor.  Psychiatric: She has a normal mood and affect. Her behavior is normal. Judgment and thought content normal.       Medications Ordered at today's visit: Meds ordered this encounter  Medications  . ibuprofen (ADVIL,MOTRIN) 800 MG tablet    Sig: Take 1 tablet (800 mg total) by mouth every 8 (eight) hours as needed.    Dispense:  30 tablet    Refill:  3    Other orders placed at today's visit: No orders of the defined types were placed in this encounter.     Assessment:    Healthy female exam.    Plan:    Contraception: tubal ligation. Follow up in: 1 year. motrin 800 TID prn cramps     Return in about 1 year (around 06/16/2017) for yearly, with Dr Despina HiddenEure.

## 2016-06-20 LAB — CYTOLOGY - PAP
DIAGNOSIS: NEGATIVE
HPV (WINDOPATH): NOT DETECTED

## 2017-06-22 ENCOUNTER — Other Ambulatory Visit: Payer: Self-pay | Admitting: Obstetrics & Gynecology

## 2017-07-25 ENCOUNTER — Encounter: Payer: Self-pay | Admitting: Obstetrics & Gynecology

## 2017-07-25 ENCOUNTER — Ambulatory Visit: Payer: Medicaid Other | Admitting: Obstetrics & Gynecology

## 2017-07-25 ENCOUNTER — Other Ambulatory Visit (HOSPITAL_COMMUNITY)
Admission: RE | Admit: 2017-07-25 | Discharge: 2017-07-25 | Disposition: A | Discharge status: Home / Self Care | Payer: Medicaid Other | Source: Ambulatory Visit | Attending: Obstetrics & Gynecology | Admitting: Obstetrics & Gynecology

## 2017-07-25 VITALS — BP 128/78 | HR 78 | Resp 18 | Ht 67.0 in | Wt 173.0 lb

## 2017-07-25 DIAGNOSIS — Z124 Encounter for screening for malignant neoplasm of cervix: Secondary | ICD-10-CM | POA: Diagnosis not present

## 2017-07-25 DIAGNOSIS — Z0000 Encounter for general adult medical examination without abnormal findings: Secondary | ICD-10-CM

## 2017-07-25 DIAGNOSIS — Z01419 Encounter for gynecological examination (general) (routine) without abnormal findings: Secondary | ICD-10-CM

## 2017-07-25 NOTE — Progress Notes (Signed)
Subjective:     Joy Harrington is a 36 y.o. female here for a routine exam.  Patient's last menstrual period was 07/17/2017 (exact date). [id number] Birth Control Method:  Tubal ligation Menstrual Calendar(currently): regular  Current complaints: none.   Current acute medical issues:  none   Recent Gynecologic History Patient's last menstrual period was 07/17/2017 (exact date). Last Pap: 2017,  normal Last mammogram: none  Past Medical History:  Diagnosis Date  . Osteoarthritis     Past Surgical History:  Procedure Laterality Date  . lap btl  2012  . TUBAL LIGATION      OB History    Gravida Para Term Preterm AB Living   4 3     1 3    SAB TAB Ectopic Multiple Live Births   1              Social History   Socioeconomic History  . Marital status: Single    Spouse name: None  . Number of children: None  . Years of education: None  . Highest education level: None  Social Needs  . Financial resource strain: None  . Food insecurity - worry: None  . Food insecurity - inability: None  . Transportation needs - medical: None  . Transportation needs - non-medical: None  Occupational History  . None  Tobacco Use  . Smoking status: Former Smoker    Last attempt to quit: 05/31/2007    Years since quitting: 10.1  . Smokeless tobacco: Never Used  Substance and Sexual Activity  . Alcohol use: No  . Drug use: No  . Sexual activity: Yes    Partners: Male    Birth control/protection: Surgical    Comment: tubal ligation  Other Topics Concern  . None  Social History Narrative  . None    Family History  Problem Relation Age of Onset  . Hypertension Father   . Diabetes Father   . Hypertension Mother   . Diabetes Mother   . Arthritis Maternal Grandmother   . Post-traumatic stress disorder Brother   . ADD / ADHD Son   . ADD / ADHD Son   . Autism Son   . ADD / ADHD Daughter      Current Outpatient Medications:  .  IBU 800 MG tablet, take 1 tablet by mouth every 8  hours if needed, Disp: 30 tablet, Rfl: 3 .  acetaminophen (TYLENOL) 500 MG tablet, Take 1,000 mg by mouth every 6 (six) hours as needed., Disp: , Rfl:  .  cyclobenzaprine (FLEXERIL) 5 MG tablet, Take 1 tablet (5 mg total) by mouth 2 (two) times daily as needed for muscle spasms. (Patient not taking: Reported on 07/25/2017), Disp: 15 tablet, Rfl: 0  Review of Systems  Review of Systems  Constitutional: Negative for fever, chills, weight loss, malaise/fatigue and diaphoresis.  HENT: Negative for hearing loss, ear pain, nosebleeds, congestion, sore throat, neck pain, tinnitus and ear discharge.   Eyes: Negative for blurred vision, double vision, photophobia, pain, discharge and redness.  Respiratory: Negative for cough, hemoptysis, sputum production, shortness of breath, wheezing and stridor.   Cardiovascular: Negative for chest pain, palpitations, orthopnea, claudication, leg swelling and PND.  Gastrointestinal: negative for abdominal pain. Negative for heartburn, nausea, vomiting, diarrhea, constipation, blood in stool and melena.  Genitourinary: Negative for dysuria, urgency, frequency, hematuria and flank pain.  Musculoskeletal: Negative for myalgias, back pain, joint pain and falls.  Skin: Negative for itching and rash.  Neurological: Negative for dizziness, tingling, tremors,  sensory change, speech change, focal weakness, seizures, loss of consciousness, weakness and headaches.  Endo/Heme/Allergies: Negative for environmental allergies and polydipsia. Does not bruise/bleed easily.  Psychiatric/Behavioral: Negative for depression, suicidal ideas, hallucinations, memory loss and substance abuse. The patient is not nervous/anxious and does not have insomnia.        Objective:  Blood pressure 128/78, pulse 78, resp. rate 18, height 5\' 7"  (1.702 m), weight 173 lb (78.5 kg), last menstrual period 07/17/2017.   Physical Exam  Vitals reviewed. Constitutional: She is oriented to person, place,  and time. She appears well-developed and well-nourished.  HENT:  Head: Normocephalic and atraumatic.        Right Ear: External ear normal.  Left Ear: External ear normal.  Nose: Nose normal.  Mouth/Throat: Oropharynx is clear and moist.  Eyes: Conjunctivae and EOM are normal. Pupils are equal, round, and reactive to light. Right eye exhibits no discharge. Left eye exhibits no discharge. No scleral icterus.  Neck: Normal range of motion. Neck supple. No tracheal deviation present. No thyromegaly present.  Cardiovascular: Normal rate, regular rhythm, normal heart sounds and intact distal pulses.  Exam reveals no gallop and no friction rub.   No murmur heard. Respiratory: Effort normal and breath sounds normal. No respiratory distress. She has no wheezes. She has no rales. She exhibits no tenderness.  GI: Soft. Bowel sounds are normal. She exhibits no distension and no mass. There is no tenderness. There is no rebound and no guarding.  Genitourinary:  Breasts no masses skin changes or nipple changes bilaterally      Vulva is normal without lesions Vagina is pink moist without discharge Cervix notable for small approx 2mm flesh colored exophytic polyp on inferior lip that is not obscuring the os. No other lesions, Pap is done Uterus is normal size shape and contour Adnexa is negative with normal sized ovaries  Musculoskeletal: Normal range of motion. She exhibits no edema and no tenderness.  Neurological: She is alert and oriented to person, place, and time. She has normal reflexes. She displays normal reflexes. No cranial nerve deficit. She exhibits normal muscle tone. Coordination normal.  Skin: Skin is warm and dry. No rash noted. No erythema. No pallor.  Psychiatric: She has a normal mood and affect. Her behavior is normal. Judgment and thought content normal.       Medications Ordered at today's visit: No orders of the defined types were placed in this encounter.   Other orders  placed at today's visit: No orders of the defined types were placed in this encounter.     Assessment:    Healthy female exam.    Plan:    Contraception: tubal ligation. Follow up in: 1 year.     Return in about 1 year (around 07/25/2018) for yearly, with Dr Despina HiddenEure.

## 2017-07-25 NOTE — Addendum Note (Signed)
Addended by: Tish FredericksonLANCASTER, Liandro Thelin A on: 07/25/2017 10:01 AM   Modules accepted: Orders

## 2017-07-26 LAB — CYTOLOGY - PAP
Diagnosis: NEGATIVE
HPV (WINDOPATH): NOT DETECTED

## 2018-03-05 ENCOUNTER — Other Ambulatory Visit: Payer: Self-pay | Admitting: Obstetrics & Gynecology

## 2018-08-21 ENCOUNTER — Encounter: Payer: Self-pay | Admitting: Obstetrics & Gynecology

## 2018-08-21 ENCOUNTER — Ambulatory Visit: Payer: Medicaid Other | Admitting: Obstetrics & Gynecology

## 2018-08-21 VITALS — BP 117/68 | HR 82 | Ht 67.0 in | Wt 175.5 lb

## 2018-08-21 DIAGNOSIS — Z01419 Encounter for gynecological examination (general) (routine) without abnormal findings: Secondary | ICD-10-CM

## 2018-08-21 DIAGNOSIS — Z0000 Encounter for general adult medical examination without abnormal findings: Secondary | ICD-10-CM | POA: Diagnosis not present

## 2018-08-21 NOTE — Progress Notes (Signed)
Subjective:     Joy Harrington is a 37 y.o. female here for a routine exam.  Patient's last menstrual period was 08/05/2018. [id number] Birth Control Method:  BTL Menstrual Calendar(currently): irregular  Current complaints: dysmenorrhea.   Current acute medical issues:     Recent Gynecologic History Patient's last menstrual period was 08/05/2018. Last Pap: 2018,  normal Last mammogram: n/a,    Past Medical History:  Diagnosis Date  . Osteoarthritis     Past Surgical History:  Procedure Laterality Date  . lap btl  2012  . TUBAL LIGATION      OB History    Gravida  4   Para  3   Term      Preterm      AB  1   Living  3     SAB  1   TAB      Ectopic      Multiple      Live Births              Social History   Socioeconomic History  . Marital status: Single    Spouse name: Not on file  . Number of children: Not on file  . Years of education: Not on file  . Highest education level: Not on file  Occupational History  . Not on file  Social Needs  . Financial resource strain: Not on file  . Food insecurity:    Worry: Not on file    Inability: Not on file  . Transportation needs:    Medical: Not on file    Non-medical: Not on file  Tobacco Use  . Smoking status: Former Smoker    Last attempt to quit: 05/31/2007    Years since quitting: 11.2  . Smokeless tobacco: Never Used  Substance and Sexual Activity  . Alcohol use: No  . Drug use: No  . Sexual activity: Not Currently    Partners: Male    Birth control/protection: Surgical    Comment: tubal ligation  Lifestyle  . Physical activity:    Days per week: Not on file    Minutes per session: Not on file  . Stress: Not on file  Relationships  . Social connections:    Talks on phone: Not on file    Gets together: Not on file    Attends religious service: Not on file    Active member of club or organization: Not on file    Attends meetings of clubs or organizations: Not on file   Relationship status: Not on file  Other Topics Concern  . Not on file  Social History Narrative  . Not on file    Family History  Problem Relation Age of Onset  . Hypertension Father   . Diabetes Father   . Hypertension Mother   . Diabetes Mother   . Arthritis Maternal Grandmother   . Post-traumatic stress disorder Brother   . ADD / ADHD Son   . ADD / ADHD Son   . Autism Son   . ADD / ADHD Daughter      Current Outpatient Medications:  .  acetaminophen (TYLENOL) 500 MG tablet, Take 1,000 mg by mouth every 6 (six) hours as needed., Disp: , Rfl:  .  cyclobenzaprine (FLEXERIL) 5 MG tablet, Take 1 tablet (5 mg total) by mouth 2 (two) times daily as needed for muscle spasms., Disp: 15 tablet, Rfl: 0 .  IBU 800 MG tablet, TAKE 1 TABLET BY MOUTH EVERY 8 HOURS  AS NEEDED, Disp: 270 tablet, Rfl: 2  Review of Systems  Review of Systems  Constitutional: Negative for fever, chills, weight loss, malaise/fatigue and diaphoresis.  HENT: Negative for hearing loss, ear pain, nosebleeds, congestion, sore throat, neck pain, tinnitus and ear discharge.   Eyes: Negative for blurred vision, double vision, photophobia, pain, discharge and redness.  Respiratory: Negative for cough, hemoptysis, sputum production, shortness of breath, wheezing and stridor.   Cardiovascular: Negative for chest pain, palpitations, orthopnea, claudication, leg swelling and PND.  Gastrointestinal: negative for abdominal pain. Negative for heartburn, nausea, vomiting, diarrhea, constipation, blood in stool and melena.  Genitourinary: Negative for dysuria, urgency, frequency, hematuria and flank pain.  Musculoskeletal: Negative for myalgias, back pain, joint pain and falls.  Skin: Negative for itching and rash.  Neurological: Negative for dizziness, tingling, tremors, sensory change, speech change, focal weakness, seizures, loss of consciousness, weakness and headaches.  Endo/Heme/Allergies: Negative for environmental  allergies and polydipsia. Does not bruise/bleed easily.  Psychiatric/Behavioral: Negative for depression, suicidal ideas, hallucinations, memory loss and substance abuse. The patient is not nervous/anxious and does not have insomnia.        Objective:  Blood pressure 117/68, pulse 82, height 5\' 7"  (1.702 m), weight 175 lb 8 oz (79.6 kg), last menstrual period 08/05/2018.   Physical Exam  Vitals reviewed. Constitutional: She is oriented to person, place, and time. She appears well-developed and well-nourished.  HENT:  Head: Normocephalic and atraumatic.        Right Ear: External ear normal.  Left Ear: External ear normal.  Nose: Nose normal.  Mouth/Throat: Oropharynx is clear and moist.  Eyes: Conjunctivae and EOM are normal. Pupils are equal, round, and reactive to light. Right eye exhibits no discharge. Left eye exhibits no discharge. No scleral icterus.  Neck: Normal range of motion. Neck supple. No tracheal deviation present. No thyromegaly present.  Cardiovascular: Normal rate, regular rhythm, normal heart sounds and intact distal pulses.  Exam reveals no gallop and no friction rub.   No murmur heard. Respiratory: Effort normal and breath sounds normal. No respiratory distress. She has no wheezes. She has no rales. She exhibits no tenderness.  GI: Soft. Bowel sounds are normal. She exhibits no distension and no mass. There is no tenderness. There is no rebound and no guarding.  Genitourinary:  Breasts no masses skin changes or nipple changes bilaterally      Vulva is normal without lesions Vagina is pink moist without discharge Cervix normal in appearance and pap is done Uterus is normal size shape and contour Adnexa is negative with normal sized ovaries   Musculoskeletal: Normal range of motion. She exhibits no edema and no tenderness.  Neurological: She is alert and oriented to person, place, and time. She has normal reflexes. She displays normal reflexes. No cranial nerve  deficit. She exhibits normal muscle tone. Coordination normal.  Skin: Skin is warm and dry. No rash noted. No erythema. No pallor.  Psychiatric: She has a normal mood and affect. Her behavior is normal. Judgment and thought content normal.       Medications Ordered at today's visit: No orders of the defined types were placed in this encounter.   Other orders placed at today's visit: No orders of the defined types were placed in this encounter.     Assessment:    Healthy female exam.    Plan:    Mammogram ordered. Follow up in: 1 years. age 40mammogram     Return in about 1 year (around 08/22/2019)  for yearly.

## 2019-05-28 ENCOUNTER — Other Ambulatory Visit: Payer: Self-pay | Admitting: Obstetrics & Gynecology

## 2019-09-09 ENCOUNTER — Ambulatory Visit (INDEPENDENT_AMBULATORY_CARE_PROVIDER_SITE_OTHER): Payer: Medicaid Other | Admitting: Obstetrics & Gynecology

## 2019-09-09 ENCOUNTER — Other Ambulatory Visit (HOSPITAL_COMMUNITY)
Admission: RE | Admit: 2019-09-09 | Discharge: 2019-09-09 | Disposition: A | Discharge status: Home / Self Care | Payer: Medicaid Other | Source: Ambulatory Visit | Attending: Obstetrics & Gynecology | Admitting: Obstetrics & Gynecology

## 2019-09-09 ENCOUNTER — Other Ambulatory Visit: Payer: Self-pay

## 2019-09-09 ENCOUNTER — Encounter: Payer: Self-pay | Admitting: Obstetrics & Gynecology

## 2019-09-09 VITALS — BP 109/66 | HR 76 | Ht 67.0 in | Wt 209.0 lb

## 2019-09-09 DIAGNOSIS — Z01419 Encounter for gynecological examination (general) (routine) without abnormal findings: Secondary | ICD-10-CM | POA: Insufficient documentation

## 2019-09-09 DIAGNOSIS — Z0000 Encounter for general adult medical examination without abnormal findings: Secondary | ICD-10-CM | POA: Diagnosis not present

## 2019-09-09 MED ORDER — NAPROXEN SODIUM 550 MG PO TABS
550.0000 mg | ORAL_TABLET | Freq: Two times a day (BID) | ORAL | 2 refills | Status: DC
Start: 2019-09-09 — End: 2020-09-15

## 2019-09-09 NOTE — Progress Notes (Signed)
Subjective:     Joy Harrington is a 39 y.o. female here for a routine exam.  Patient's last menstrual period was 08/18/2019 (exact date). [id number] Birth Control Method:  BTL Menstrual Calendar(currently): regular  Current complaints: dysmenorrhea.   Current acute medical issues:  none   Recent Gynecologic History Patient's last menstrual period was 08/18/2019 (exact date). Last Pap: 2018,  normal Last mammogram: ,  n/a  Past Medical History:  Diagnosis Date  . Osteoarthritis     Past Surgical History:  Procedure Laterality Date  . lap btl  2012  . TUBAL LIGATION      OB History    Gravida  4   Para  3   Term      Preterm      AB  1   Living  3     SAB  1   TAB      Ectopic      Multiple      Live Births              Social History   Socioeconomic History  . Marital status: Single    Spouse name: Not on file  . Number of children: Not on file  . Years of education: Not on file  . Highest education level: Not on file  Occupational History  . Not on file  Tobacco Use  . Smoking status: Former Smoker    Quit date: 05/31/2007    Years since quitting: 12.2  . Smokeless tobacco: Never Used  Substance and Sexual Activity  . Alcohol use: No  . Drug use: No  . Sexual activity: Not Currently    Partners: Male    Birth control/protection: Surgical    Comment: tubal ligation  Other Topics Concern  . Not on file  Social History Narrative  . Not on file   Social Determinants of Health   Financial Resource Strain:   . Difficulty of Paying Living Expenses: Not on file  Food Insecurity:   . Worried About Programme researcher, broadcasting/film/video in the Last Year: Not on file  . Ran Out of Food in the Last Year: Not on file  Transportation Needs:   . Lack of Transportation (Medical): Not on file  . Lack of Transportation (Non-Medical): Not on file  Physical Activity:   . Days of Exercise per Week: Not on file  . Minutes of Exercise per Session: Not on file  Stress:    . Feeling of Stress : Not on file  Social Connections:   . Frequency of Communication with Friends and Family: Not on file  . Frequency of Social Gatherings with Friends and Family: Not on file  . Attends Religious Services: Not on file  . Active Member of Clubs or Organizations: Not on file  . Attends Banker Meetings: Not on file  . Marital Status: Not on file    Family History  Problem Relation Age of Onset  . Hypertension Father   . Diabetes Father   . Hypertension Mother   . Diabetes Mother   . Arthritis Maternal Grandmother   . Post-traumatic stress disorder Brother   . ADD / ADHD Son   . ADD / ADHD Son   . Autism Son   . ADD / ADHD Daughter      Current Outpatient Medications:  .  naproxen sodium (ANAPROX DS) 550 MG tablet, Take 1 tablet (550 mg total) by mouth 2 (two) times daily with a meal., Disp:  30 tablet, Rfl: 2  Review of Systems  Review of Systems  Constitutional: Negative for fever, chills, weight loss, malaise/fatigue and diaphoresis.  HENT: Negative for hearing loss, ear pain, nosebleeds, congestion, sore throat, neck pain, tinnitus and ear discharge.   Eyes: Negative for blurred vision, double vision, photophobia, pain, discharge and redness.  Respiratory: Negative for cough, hemoptysis, sputum production, shortness of breath, wheezing and stridor.   Cardiovascular: Negative for chest pain, palpitations, orthopnea, claudication, leg swelling and PND.  Gastrointestinal: negative for abdominal pain. Negative for heartburn, nausea, vomiting, diarrhea, constipation, blood in stool and melena.  Genitourinary: Negative for dysuria, urgency, frequency, hematuria and flank pain.  Musculoskeletal: Negative for myalgias, back pain, joint pain and falls.  Skin: Negative for itching and rash.  Neurological: Negative for dizziness, tingling, tremors, sensory change, speech change, focal weakness, seizures, loss of consciousness, weakness and headaches.   Endo/Heme/Allergies: Negative for environmental allergies and polydipsia. Does not bruise/bleed easily.  Psychiatric/Behavioral: Negative for depression, suicidal ideas, hallucinations, memory loss and substance abuse. The patient is not nervous/anxious and does not have insomnia.        Objective:  Blood pressure 109/66, pulse 76, height 5\' 7"  (1.702 m), weight 209 lb (94.8 kg), last menstrual period 08/18/2019.   Physical Exam  Vitals reviewed. Constitutional: She is oriented to person, place, and time. She appears well-developed and well-nourished.  HENT:  Head: Normocephalic and atraumatic.        Right Ear: External ear normal.  Left Ear: External ear normal.  Nose: Nose normal.  Mouth/Throat: Oropharynx is clear and moist.  Eyes: Conjunctivae and EOM are normal. Pupils are equal, round, and reactive to light. Right eye exhibits no discharge. Left eye exhibits no discharge. No scleral icterus.  Neck: Normal range of motion. Neck supple. No tracheal deviation present. No thyromegaly present.  Cardiovascular: Normal rate, regular rhythm, normal heart sounds and intact distal pulses.  Exam reveals no gallop and no friction rub.   No murmur heard. Respiratory: Effort normal and breath sounds normal. No respiratory distress. She has no wheezes. She has no rales. She exhibits no tenderness.  GI: Soft. Bowel sounds are normal. She exhibits no distension and no mass. There is no tenderness. There is no rebound and no guarding.  Genitourinary:  Breasts no masses skin changes or nipple changes bilaterally      Vulva is normal without lesions Vagina is pink moist without discharge Cervix normal in appearance and pap is done Uterus is normal size shape and contour Adnexa is negative with normal sized ovaries   Musculoskeletal: Normal range of motion. She exhibits no edema and no tenderness.  Neurological: She is alert and oriented to person, place, and time. She has normal reflexes. She  displays normal reflexes. No cranial nerve deficit. She exhibits normal muscle tone. Coordination normal.  Skin: Skin is warm and dry. No rash noted. No erythema. No pallor.  Psychiatric: She has a normal mood and affect. Her behavior is normal. Judgment and thought content normal.       Medications Ordered at today's visit: Meds ordered this encounter  Medications  . naproxen sodium (ANAPROX DS) 550 MG tablet    Sig: Take 1 tablet (550 mg total) by mouth 2 (two) times daily with a meal.    Dispense:  30 tablet    Refill:  2    Other orders placed at today's visit: No orders of the defined types were placed in this encounter.     Assessment:  Healthy female exam.    Plan:    Contraception: tubal ligation. Follow up in: 1 year.     Return in about 1 year (around 09/08/2020) for yearly, with Dr Elonda Husky.

## 2019-09-11 LAB — CYTOLOGY - PAP
Comment: NEGATIVE
Diagnosis: NEGATIVE
High risk HPV: NEGATIVE

## 2020-06-15 ENCOUNTER — Other Ambulatory Visit: Payer: Medicaid Other

## 2020-06-15 ENCOUNTER — Other Ambulatory Visit: Payer: Self-pay | Admitting: *Deleted

## 2020-06-15 DIAGNOSIS — Z20822 Contact with and (suspected) exposure to covid-19: Secondary | ICD-10-CM

## 2020-06-16 LAB — NOVEL CORONAVIRUS, NAA: SARS-CoV-2, NAA: DETECTED — AB

## 2020-06-16 LAB — SPECIMEN STATUS REPORT

## 2020-06-16 LAB — SARS-COV-2, NAA 2 DAY TAT

## 2020-06-17 ENCOUNTER — Telehealth: Payer: Self-pay | Admitting: Nurse Practitioner

## 2020-06-17 ENCOUNTER — Telehealth: Payer: Self-pay | Admitting: Physician Assistant

## 2020-06-17 NOTE — Telephone Encounter (Signed)
  Called to discuss with patient about Covid symptoms and the use of a monoclonal antibody infusion for those with mild to moderate Covid symptoms and at a high risk of hospitalization.   Message left to call back our hotline [phone] and send my chart message.  Manson Passey, PA - C

## 2020-06-17 NOTE — Telephone Encounter (Signed)
Called to Discuss with patient about Covid symptoms and the use of the monoclonal antibody infusion for those with mild to moderate Covid symptoms and at a high risk of hospitalization.     Pt appears to qualify for this infusion due to co-morbid conditions and/or a member of an at-risk group in accordance with the FDA Emergency Use Authorization.    Unable to reach pt - left voicemail requesting return call to [phone].

## 2020-06-18 ENCOUNTER — Telehealth: Payer: Self-pay | Admitting: Physician Assistant

## 2020-06-18 ENCOUNTER — Other Ambulatory Visit: Payer: Self-pay | Admitting: Physician Assistant

## 2020-06-18 DIAGNOSIS — U071 COVID-19: Secondary | ICD-10-CM

## 2020-06-18 NOTE — Telephone Encounter (Signed)
I connected by phone with Joy Harrington on 06/18/2020 at 1:08 PM to discuss the potential use of a new treatment for mild to moderate COVID-19 viral infection in non-hospitalized patients.  This patient is a 39 y.o. female that meets the FDA criteria for Emergency Use Authorization of COVID monoclonal antibody casirivimab/imdevimab or bamlanivimab/eteseviamb.  Has a (+) direct SARS-CoV-2 viral test result  Has mild or moderate COVID-19   Is NOT hospitalized due to COVID-19  Is within 10 days of symptom onset  Has at least one of the high risk factor(s) for progression to severe COVID-19 and/or hospitalization as defined in EUA.  Specific high risk criteria : BMI > 25 and Other high risk medical condition per CDC:  geographic location    I have spoken and communicated the following to the patient or parent/caregiver regarding COVID monoclonal antibody treatment:  1. FDA has authorized the emergency use for the treatment of mild to moderate COVID-19 in adults and pediatric patients with positive results of direct SARS-CoV-2 viral testing who are 86 years of age and older weighing at least 40 kg, and who are at high risk for progressing to severe COVID-19 and/or hospitalization.  2. The significant known and potential risks and benefits of COVID monoclonal antibody, and the extent to which such potential risks and benefits are unknown.  3. Information on available alternative treatments and the risks and benefits of those alternatives, including clinical trials.  4. Patients treated with COVID monoclonal antibody should continue to self-isolate and use infection control measures (e.g., wear mask, isolate, social distance, avoid sharing personal items, clean and disinfect "high touch" surfaces, and frequent handwashing) according to CDC guidelines.   5. The patient or parent/caregiver has the option to accept or refuse COVID monoclonal antibody treatment.  After reviewing this information  with the patient, the patient has agreed to receive one of the available covid 19 monoclonal antibodies and will be provided an appropriate fact sheet prior to infusion.   [place], [place] 06/18/2020 1:08 PM

## 2020-06-19 ENCOUNTER — Ambulatory Visit (HOSPITAL_COMMUNITY)
Admission: RE | Admit: 2020-06-19 | Discharge: 2020-06-19 | Disposition: A | Discharge status: Home / Self Care | Payer: Medicaid Other | Source: Ambulatory Visit | Attending: Pulmonary Disease | Admitting: Pulmonary Disease

## 2020-06-19 DIAGNOSIS — U071 COVID-19: Secondary | ICD-10-CM | POA: Insufficient documentation

## 2020-06-19 MED ORDER — SODIUM CHLORIDE 0.9 % IV SOLN: INTRAVENOUS | Status: DC | PRN

## 2020-06-19 MED ORDER — ALBUTEROL SULFATE HFA 108 (90 BASE) MCG/ACT IN AERS: 2.0000 | INHALATION_SPRAY | Freq: Once | RESPIRATORY_TRACT | Status: DC | PRN

## 2020-06-19 MED ORDER — DIPHENHYDRAMINE HCL 50 MG/ML IJ SOLN: 50.0000 mg | Freq: Once | INTRAMUSCULAR | Status: DC | PRN

## 2020-06-19 MED ORDER — EPINEPHRINE 0.3 MG/0.3ML IJ SOAJ: 0.3000 mg | Freq: Once | INTRAMUSCULAR | Status: DC | PRN

## 2020-06-19 MED ORDER — METHYLPREDNISOLONE SODIUM SUCC 125 MG IJ SOLR: 125.0000 mg | Freq: Once | INTRAMUSCULAR | Status: DC | PRN

## 2020-06-19 MED ORDER — SODIUM CHLORIDE 0.9 % IV SOLN: Freq: Once | INTRAVENOUS | Status: AC

## 2020-06-19 MED ORDER — FAMOTIDINE IN NACL 20-0.9 MG/50ML-% IV SOLN: 20.0000 mg | Freq: Once | INTRAVENOUS | Status: DC | PRN

## 2020-06-19 NOTE — Progress Notes (Signed)
  Diagnosis: COVID-19  Physician: Dr. Wright  Procedure: Covid Infusion Clinic Med: bamlanivimab\etesevimab infusion - Provided patient with bamlanimivab\etesevimab fact sheet for patients, parents and caregivers prior to infusion.  Complications: No immediate complications noted.  Discharge: Discharged home   Hafsa Lohn J Nixxon Faria 06/19/2020  

## 2020-06-19 NOTE — Discharge Instructions (Signed)

## 2020-08-24 ENCOUNTER — Other Ambulatory Visit: Payer: Self-pay | Admitting: Obstetrics & Gynecology

## 2020-09-09 ENCOUNTER — Other Ambulatory Visit: Payer: Self-pay

## 2020-09-09 ENCOUNTER — Ambulatory Visit
Admission: EM | Admit: 2020-09-09 | Discharge: 2020-09-09 | Disposition: A | Discharge status: Home / Self Care | Payer: Medicaid Other | Attending: Emergency Medicine | Admitting: Emergency Medicine

## 2020-09-09 ENCOUNTER — Ambulatory Visit (INDEPENDENT_AMBULATORY_CARE_PROVIDER_SITE_OTHER): Payer: Medicaid Other

## 2020-09-09 ENCOUNTER — Ambulatory Visit: Payer: Self-pay

## 2020-09-09 DIAGNOSIS — M25561 Pain in right knee: Secondary | ICD-10-CM

## 2020-09-09 DIAGNOSIS — M25571 Pain in right ankle and joints of right foot: Secondary | ICD-10-CM

## 2020-09-09 DIAGNOSIS — S82831A Other fracture of upper and lower end of right fibula, initial encounter for closed fracture: Secondary | ICD-10-CM | POA: Diagnosis not present

## 2020-09-09 DIAGNOSIS — W19XXXA Unspecified fall, initial encounter: Secondary | ICD-10-CM | POA: Diagnosis not present

## 2020-09-09 NOTE — Discharge Instructions (Addendum)
  Continue to take ibuprofen 800 mg as needed for pain Follow-up with orthopedic Follow RICE instruction that is attached  Return or go to ED if you develop any new or worsening of symptoms

## 2020-09-09 NOTE — ED Provider Notes (Signed)
[facility]   [id number] 09/09/20 Arrival Time: 0903   Chief Complaint  Patient presents with  . Fall     SUBJECTIVE: History from: patient and family.  Joy Harrington is a 40 y.o. female who presented to the urgent care with a complaint of fall that occurred the past 1 to 2 days. States she fell in the snow. Localized the pain to the right knee and right ankle. She describes the pain as constant and achy. She has tried ibuprofen 800 mg with relief. Her symptoms are made worse with ROM. She denies similar symptoms in the past. Denies chills, fever, nausea, vomiting, diarrhea  ROS: As per HPI.  All other pertinent ROS negative.      Past Medical History:  Diagnosis Date  . Osteoarthritis    Past Surgical History:  Procedure Laterality Date  . lap btl  2012  . TUBAL LIGATION     No Known Allergies No current facility-administered medications on file prior to encounter.   Current Outpatient Medications on File Prior to Encounter  Medication Sig Dispense Refill  . ibuprofen (ADVIL) 800 MG tablet TAKE 1 TABLET BY MOUTH EVERY 8 HOURS AS NEEDED 270 tablet 2  . naproxen sodium (ANAPROX DS) 550 MG tablet Take 1 tablet (550 mg total) by mouth 2 (two) times daily with a meal. 30 tablet 2   Social History   Socioeconomic History  . Marital status: Single    Spouse name: Not on file  . Number of children: Not on file  . Years of education: Not on file  . Highest education level: Not on file  Occupational History  . Not on file  Tobacco Use  . Smoking status: Former Smoker    Quit date: 05/31/2007    Years since quitting: 13.2  . Smokeless tobacco: Never Used  Vaping Use  . Vaping Use: Never used  Substance and Sexual Activity  . Alcohol use: No  . Drug use: No  . Sexual activity: Not Currently    Partners: Male    Birth control/protection: Surgical    Comment: tubal ligation  Other Topics Concern  . Not on file  Social History Narrative  . Not on file    Social Determinants of Health   Financial Resource Strain: Not on file  Food Insecurity: Not on file  Transportation Needs: Not on file  Physical Activity: Not on file  Stress: Not on file  Social Connections: Not on file  Intimate Partner Violence: Not on file   Family History  Problem Relation Age of Onset  . Hypertension Father   . Diabetes Father   . Hypertension Mother   . Diabetes Mother   . Arthritis Maternal Grandmother   . Post-traumatic stress disorder Brother   . ADD / ADHD Son   . ADD / ADHD Son   . Autism Son   . ADD / ADHD Daughter     OBJECTIVE:  Vitals:   09/09/20 0935  BP: 131/84  Pulse: 100  Resp: 19  Temp: 98.4 F (36.9 C)  SpO2: 98%     Physical Exam Vitals and nursing note reviewed.  Constitutional:      General: She is not in acute distress.    Appearance: Normal appearance. She is normal weight. She is not ill-appearing, toxic-appearing or diaphoretic.  HENT:     Head: Normocephalic.  Cardiovascular:     Rate and Rhythm: Normal rate and regular rhythm.     Pulses: Normal pulses.  Heart sounds: Normal heart sounds. No murmur heard. No friction rub. No gallop.   Pulmonary:     Effort: Pulmonary effort is normal. No respiratory distress.     Breath sounds: Normal breath sounds. No stridor. No wheezing, rhonchi or rales.  Chest:     Chest wall: No tenderness.  Musculoskeletal:        General: Tenderness present.     Right knee: Tenderness present.     Right ankle: Swelling present. Tenderness present.     Comments: The right knee is without any obvious asymmetry or deformity when compared to the left knee. There is no ecchymosis, open wound, lesion or warmth present. Tenderness present on palpation of patella. Limited range of motion due to pain. Neurovascular status intact.  The right ankle is with obvious deformity when compared to the left ankle. Swelling present. There is no ecchymosis, open wound, lesion or warmth present.  Limited range of motion. Neurovascular status intact  Neurological:     Mental Status: She is alert and oriented to person, place, and time.      LABS:  No results found for this or any previous visit (from the past 24 hour(s)).   RADIOLOGY:  DG Ankle Complete Right  Result Date: 09/09/2020 CLINICAL DATA:  Recent fall with ankle pain, initial encounter EXAM: RIGHT ANKLE - COMPLETE 3+ VIEW COMPARISON:  None. FINDINGS: There is a mildly displaced distal fibular fracture with associated soft tissue swelling. Distal tibia appears within normal limits. No other focal abnormality is seen. IMPRESSION: Mildly displaced distal fibular fracture. Electronically Signed   By: Alcide Clever M.D.   On: 09/09/2020 09:56   DG Knee Complete 4 Views Right  Result Date: 09/09/2020 CLINICAL DATA:  Recent fall with knee pain, initial encounter EXAM: RIGHT KNEE - COMPLETE 4+ VIEW COMPARISON:  None. FINDINGS: No evidence of fracture, dislocation, or joint effusion. No evidence of arthropathy or other focal bone abnormality. Soft tissues are unremarkable. IMPRESSION: No acute abnormality noted. Electronically Signed   By: Alcide Clever M.D.   On: 09/09/2020 09:59    X-ray is positive for displaced fracture of the distal fibular bone.  I have reviewed the x-ray myself and the radiologist interpretation.  I am in agreement with the radiologist interpretation.   ASSESSMENT & PLAN:  1. Fall, initial encounter   2. Acute pain of right knee   3. Acute right ankle pain   4. Closed fracture of distal end of right fibula, unspecified fracture morphology, initial encounter     No orders of the defined types were placed in this encounter.  Discharge instructions  Continue to take ibuprofen 800 mg as needed for pain Follow-up with orthopedic Follow RICE instruction that is attached  Return or go to ED if you develop any new or worsening of symptoms   Reviewed expectations re: course of current medical issues.  Questions answered. Outlined signs and symptoms indicating need for more acute intervention. Patient verbalized understanding. After Visit Summary given.         Durward Parcel, FNP 09/09/20 1025

## 2020-09-09 NOTE — ED Triage Notes (Signed)
Pt reports falling in the snow on Monday. Pt has pain and swelling in her right knee and right ankle. Patient has limited range of motion. Ambulatory. Denies any loc.

## 2020-09-15 ENCOUNTER — Ambulatory Visit (INDEPENDENT_AMBULATORY_CARE_PROVIDER_SITE_OTHER): Payer: Medicaid Other | Admitting: Orthopaedic Surgery

## 2020-09-15 ENCOUNTER — Encounter: Payer: Self-pay | Admitting: Orthopaedic Surgery

## 2020-09-15 ENCOUNTER — Other Ambulatory Visit: Payer: Self-pay

## 2020-09-15 VITALS — BP 160/99 | HR 90 | Ht 67.0 in | Wt 209.0 lb

## 2020-09-15 DIAGNOSIS — W000XXA Fall on same level due to ice and snow, initial encounter: Secondary | ICD-10-CM

## 2020-09-15 DIAGNOSIS — S8264XA Nondisplaced fracture of lateral malleolus of right fibula, initial encounter for closed fracture: Secondary | ICD-10-CM | POA: Diagnosis not present

## 2020-09-15 NOTE — Progress Notes (Signed)
Subjective:    Patient ID: Joy Harrington, female    DOB: 02/28/1981, 40 y.o.   MRN: [id number]  HPI She slipped in the snow January 3rd and hurt her right ankle.  She was seen at Urgent Care.  X-rays showed a nondisplaced fracture of the right lateral malleolus.  She was given a CAM walker.  Her pain is controlled.  She uses a cane.  She has no other injury.   Review of Systems  Constitutional: Positive for activity change.  Musculoskeletal: Positive for gait problem and joint swelling.  All other systems reviewed and are negative.  For Review of Systems, all other systems reviewed and are negative.  The following is a summary of the past history medically, past history surgically, known current medicines, social history and family history.  This information is gathered electronically by the computer from prior information and documentation.  I review this each visit and have found including this information at this point in the chart is beneficial and informative.   Past Medical History:  Diagnosis Date  . Osteoarthritis     Past Surgical History:  Procedure Laterality Date  . lap btl  2012  . TUBAL LIGATION      Current Outpatient Medications on File Prior to Visit  Medication Sig Dispense Refill  . ibuprofen (ADVIL) 800 MG tablet Take 800 mg by mouth every 8 (eight) hours as needed.     No current facility-administered medications on file prior to visit.    Social History   Socioeconomic History  . Marital status: Single    Spouse name: Not on file  . Number of children: Not on file  . Years of education: Not on file  . Highest education level: Not on file  Occupational History  . Not on file  Tobacco Use  . Smoking status: Former Smoker    Quit date: 05/31/2007    Years since quitting: 13.3  . Smokeless tobacco: Never Used  Vaping Use  . Vaping Use: Never used  Substance and Sexual Activity  . Alcohol use: No  . Drug use: No  . Sexual activity: Not Currently     Partners: Male    Birth control/protection: Surgical    Comment: tubal ligation  Other Topics Concern  . Not on file  Social History Narrative  . Not on file   Social Determinants of Health   Financial Resource Strain: Not on file  Food Insecurity: Not on file  Transportation Needs: Not on file  Physical Activity: Not on file  Stress: Not on file  Social Connections: Not on file  Intimate Partner Violence: Not on file    Family History  Problem Relation Age of Onset  . Hypertension Father   . Diabetes Father   . Hypertension Mother   . Diabetes Mother   . Arthritis Maternal Grandmother   . Post-traumatic stress disorder Brother   . ADD / ADHD Son   . ADD / ADHD Son   . Autism Son   . ADD / ADHD Daughter     BP (!) 160/99   Pulse 90   Ht 5\' 7"  (1.702 m)   Wt 209 lb (94.8 kg)   LMP 08/27/2020   BMI 32.73 kg/m   Body mass index is 32.73 kg/m.      Objective:   Physical Exam Vitals and nursing note reviewed. Exam conducted with a chaperone present.  Constitutional:      Appearance: She is well-developed and well-nourished.  HENT:     Head: Normocephalic and atraumatic.  Eyes:     Extraocular Movements: EOM normal.     Conjunctiva/sclera: Conjunctivae normal.     Pupils: Pupils are equal, round, and reactive to light.  Cardiovascular:     Rate and Rhythm: Normal rate and regular rhythm.     Pulses: Intact distal pulses.  Pulmonary:     Effort: Pulmonary effort is normal.  Abdominal:     Palpations: Abdomen is soft.  Musculoskeletal:     Cervical back: Normal range of motion and neck supple.       Feet:  Skin:    General: Skin is warm and dry.  Neurological:     Mental Status: She is alert and oriented to person, place, and time.     Cranial Nerves: No cranial nerve deficit.     Motor: No abnormal muscle tone.     Coordination: Coordination normal.     Deep Tendon Reflexes: Reflexes are normal and symmetric. Reflexes normal.  Psychiatric:         Mood and Affect: Mood and affect normal.        Behavior: Behavior normal.        Thought Content: Thought content normal.        Judgment: Judgment normal.    I have independently reviewed and interpreted x-rays of this patient done at another site by another physician or qualified health professional.  I have reviewed the notes from Urgent Care as well.      Assessment & Plan:   Encounter Diagnosis  Name Primary?  . Closed nondisplaced fracture of lateral malleolus of right fibula, initial encounter Yes   Continue the CAM walker and cane.  Elevate, ice.  Return in two weeks.  X-rays on return.  Call if any problem.  Precautions discussed.   Electronically Signed Darreld Mclean, MD 1/11/20228:20 AM

## 2020-09-29 ENCOUNTER — Other Ambulatory Visit: Payer: Self-pay

## 2020-09-29 ENCOUNTER — Encounter: Payer: Self-pay | Admitting: Orthopaedic Surgery

## 2020-09-29 ENCOUNTER — Ambulatory Visit (INDEPENDENT_AMBULATORY_CARE_PROVIDER_SITE_OTHER): Payer: Medicaid Other | Admitting: Orthopaedic Surgery

## 2020-09-29 ENCOUNTER — Ambulatory Visit: Payer: Medicaid Other

## 2020-09-29 VITALS — BP 140/65 | HR 79 | Ht 67.0 in | Wt 210.0 lb

## 2020-09-29 DIAGNOSIS — S8264XA Nondisplaced fracture of lateral malleolus of right fibula, initial encounter for closed fracture: Secondary | ICD-10-CM

## 2020-09-29 DIAGNOSIS — S8264XD Nondisplaced fracture of lateral malleolus of right fibula, subsequent encounter for closed fracture with routine healing: Secondary | ICD-10-CM

## 2020-09-29 NOTE — Progress Notes (Signed)
I am better  She is wearing the CAM walker on the right and doing well.  She has no new trauma.  NV intact.    X-rays were done of the right ankle, reported separately.  Encounter Diagnosis  Name Primary?  . Closed nondisplaced fracture of lateral malleolus of right fibula with routine healing, subsequent encounter Yes   Continue the CAM walker.  Return in three weeks.  X-rays on return of right ankle.  Call if any problem.  Precautions discussed.   Electronically Signed Darreld Mclean, MD 1/25/20228:23 AM

## 2020-10-02 ENCOUNTER — Other Ambulatory Visit: Payer: Medicaid Other | Admitting: Obstetrics & Gynecology

## 2020-10-20 ENCOUNTER — Other Ambulatory Visit: Payer: Self-pay

## 2020-10-20 ENCOUNTER — Ambulatory Visit (INDEPENDENT_AMBULATORY_CARE_PROVIDER_SITE_OTHER): Payer: Medicaid Other | Admitting: Orthopaedic Surgery

## 2020-10-20 ENCOUNTER — Encounter: Payer: Self-pay | Admitting: Orthopaedic Surgery

## 2020-10-20 ENCOUNTER — Ambulatory Visit: Payer: Medicaid Other

## 2020-10-20 VITALS — Ht 67.0 in | Wt 210.0 lb

## 2020-10-20 DIAGNOSIS — S8264XD Nondisplaced fracture of lateral malleolus of right fibula, subsequent encounter for closed fracture with routine healing: Secondary | ICD-10-CM | POA: Diagnosis not present

## 2020-10-20 DIAGNOSIS — M25571 Pain in right ankle and joints of right foot: Secondary | ICD-10-CM

## 2020-10-20 NOTE — Progress Notes (Signed)
I feel better  Her right ankle is less painful but tender at times. She has been using the CAM walker. She has no new trauma.  NV intact.  X-rays were done of the right ankle, reported separately.  Encounter Diagnoses  Name Primary?  . Pain in right ankle and joints of right foot   . Closed nondisplaced fracture of lateral malleolus of right fibula with routine healing, subsequent encounter Yes   Begin coming out of the CAM walker in the house.  Wear the CAM walker outside the house.  Return in two weeks.  X-rays of the right ankle on return.  Call if any problem.  Precautions discussed.   Electronically Signed Darreld Mclean, MD 2/15/20228:30 AM

## 2020-10-27 ENCOUNTER — Telehealth: Payer: Self-pay | Admitting: Orthopaedic Surgery

## 2020-10-27 MED ORDER — HYDROCODONE-ACETAMINOPHEN 5-325 MG PO TABS: ORAL_TABLET | ORAL | 0 refills | Status: DC

## 2020-10-27 NOTE — Telephone Encounter (Signed)
Patient is requesting something for pain.  She said that she is hurting more now that she is walking more.  She states she uses [facility] on [address]

## 2020-11-02 ENCOUNTER — Other Ambulatory Visit: Payer: Self-pay

## 2020-11-02 ENCOUNTER — Encounter: Payer: Self-pay | Admitting: Obstetrics & Gynecology

## 2020-11-02 ENCOUNTER — Ambulatory Visit (INDEPENDENT_AMBULATORY_CARE_PROVIDER_SITE_OTHER): Payer: Medicaid Other | Admitting: Obstetrics & Gynecology

## 2020-11-02 VITALS — BP 134/81 | HR 109 | Ht 67.0 in | Wt 208.0 lb

## 2020-11-02 DIAGNOSIS — Z01419 Encounter for gynecological examination (general) (routine) without abnormal findings: Secondary | ICD-10-CM | POA: Diagnosis not present

## 2020-11-02 NOTE — Progress Notes (Signed)
Subjective:     Joy Harrington is a 40 y.o. female here for a routine exam.  No LMP recorded. [id number] Birth Control Method:  Lap BTL Menstrual Calendar(currently): regular  Current complaints: none.   Current acute medical issues:     Recent Gynecologic History No LMP recorded. Last Pap: 09/2019,  normal Last mammogram: n/a,    Past Medical History:  Diagnosis Date  . Osteoarthritis     Past Surgical History:  Procedure Laterality Date  . lap btl  2012  . TUBAL LIGATION      OB History    Gravida  4   Para  3   Term      Preterm      AB  1   Living  3     SAB  1   IAB      Ectopic      Multiple      Live Births              Social History   Socioeconomic History  . Marital status: Single    Spouse name: Not on file  . Number of children: Not on file  . Years of education: Not on file  . Highest education level: Not on file  Occupational History  . Not on file  Tobacco Use  . Smoking status: Former Smoker    Quit date: 05/31/2007    Years since quitting: 13.4  . Smokeless tobacco: Never Used  Vaping Use  . Vaping Use: Never used  Substance and Sexual Activity  . Alcohol use: No  . Drug use: No  . Sexual activity: Not Currently    Partners: Male    Birth control/protection: Surgical    Comment: tubal ligation  Other Topics Concern  . Not on file  Social History Narrative  . Not on file   Social Determinants of Health   Financial Resource Strain: Medium Risk  . Difficulty of Paying Living Expenses: Somewhat hard  Food Insecurity: Food Insecurity Present  . Worried About Programme researcher, broadcasting/film/video in the Last Year: Sometimes true  . Ran Out of Food in the Last Year: Sometimes true  Transportation Needs: No Transportation Needs  . Lack of Transportation (Medical): No  . Lack of Transportation (Non-Medical): No  Physical Activity: Insufficiently Active  . Days of Exercise per Week: 3 days  . Minutes of Exercise per Session: 30 min   Stress: Stress Concern Present  . Feeling of Stress : To some extent  Social Connections: Moderately Isolated  . Frequency of Communication with Friends and Family: More than three times a week  . Frequency of Social Gatherings with Friends and Family: Once a week  . Attends Religious Services: More than 4 times per year  . Active Member of Clubs or Organizations: No  . Attends Banker Meetings: Never  . Marital Status: Never married    Family History  Problem Relation Age of Onset  . Hypertension Father   . Diabetes Father   . Hypertension Mother   . Diabetes Mother   . Arthritis Maternal Grandmother   . Post-traumatic stress disorder Brother   . ADD / ADHD Son   . ADD / ADHD Son   . Autism Son   . ADD / ADHD Daughter      Current Outpatient Medications:  .  HYDROcodone-acetaminophen (NORCO/VICODIN) 5-325 MG tablet, One tablet every four hours for pain., Disp: 30 tablet, Rfl: 0 .  ibuprofen (ADVIL) 800  MG tablet, Take 800 mg by mouth every 8 (eight) hours as needed., Disp: , Rfl:   Review of Systems  Review of Systems  Constitutional: Negative for fever, chills, weight loss, malaise/fatigue and diaphoresis.  HENT: Negative for hearing loss, ear pain, nosebleeds, congestion, sore throat, neck pain, tinnitus and ear discharge.   Eyes: Negative for blurred vision, double vision, photophobia, pain, discharge and redness.  Respiratory: Negative for cough, hemoptysis, sputum production, shortness of breath, wheezing and stridor.   Cardiovascular: Negative for chest pain, palpitations, orthopnea, claudication, leg swelling and PND.  Gastrointestinal: negative for abdominal pain. Negative for heartburn, nausea, vomiting, diarrhea, constipation, blood in stool and melena.  Genitourinary: Negative for dysuria, urgency, frequency, hematuria and flank pain.  Musculoskeletal: Negative for myalgias, back pain, joint pain and falls.  Skin: Negative for itching and rash.   Neurological: Negative for dizziness, tingling, tremors, sensory change, speech change, focal weakness, seizures, loss of consciousness, weakness and headaches.  Endo/Heme/Allergies: Negative for environmental allergies and polydipsia. Does not bruise/bleed easily.  Psychiatric/Behavioral: Negative for depression, suicidal ideas, hallucinations, memory loss and substance abuse. The patient is not nervous/anxious and does not have insomnia.        Objective:  Blood pressure 134/81, pulse (!) 109, height 5\' 7"  (1.702 m), weight 208 lb (94.3 kg).   Physical Exam  Vitals reviewed. Constitutional: She is oriented to person, place, and time. She appears well-developed and well-nourished.  HENT:  Head: Normocephalic and atraumatic.        Right Ear: External ear normal.  Left Ear: External ear normal.  Nose: Nose normal.  Mouth/Throat: Oropharynx is clear and moist.  Eyes: Conjunctivae and EOM are normal. Pupils are equal, round, and reactive to light. Right eye exhibits no discharge. Left eye exhibits no discharge. No scleral icterus.  Neck: Normal range of motion. Neck supple. No tracheal deviation present. No thyromegaly present.  Cardiovascular: Normal rate, regular rhythm, normal heart sounds and intact distal pulses.  Exam reveals no gallop and no friction rub.   No murmur heard. Respiratory: Effort normal and breath sounds normal. No respiratory distress. She has no wheezes. She has no rales. She exhibits no tenderness.  GI: Soft. Bowel sounds are normal. She exhibits no distension and no mass. There is no tenderness. There is no rebound and no guarding.  Genitourinary:  Breasts no masses skin changes or nipple changes bilaterally      Vulva is normal without lesions Vagina is pink moist without discharge Cervix normal in appearance and pap is done Uterus is normal size shape and contour Adnexa is negative with normal sized ovaries   Musculoskeletal: Normal range of motion. She  exhibits no edema and no tenderness.  Neurological: She is alert and oriented to person, place, and time. She has normal reflexes. She displays normal reflexes. No cranial nerve deficit. She exhibits normal muscle tone. Coordination normal.  Skin: Skin is warm and dry. No rash noted. No erythema. No pallor.  Psychiatric: She has a normal mood and affect. Her behavior is normal. Judgment and thought content normal.       Medications Ordered at today's visit: No orders of the defined types were placed in this encounter.   Other orders placed at today's visit: No orders of the defined types were placed in this encounter.     Assessment:    Normal Gyn exam.    Plan:    Contraception: tubal ligation. Mammogram ordered. Follow up in: 3 years.     No  follow-ups on file.

## 2020-11-03 ENCOUNTER — Ambulatory Visit (INDEPENDENT_AMBULATORY_CARE_PROVIDER_SITE_OTHER): Payer: Medicaid Other | Admitting: Orthopaedic Surgery

## 2020-11-03 ENCOUNTER — Ambulatory Visit: Payer: Medicaid Other

## 2020-11-03 ENCOUNTER — Encounter: Payer: Self-pay | Admitting: Orthopaedic Surgery

## 2020-11-03 VITALS — BP 156/86 | HR 85 | Ht 67.0 in | Wt 208.0 lb

## 2020-11-03 DIAGNOSIS — M25571 Pain in right ankle and joints of right foot: Secondary | ICD-10-CM | POA: Diagnosis not present

## 2020-11-03 NOTE — Progress Notes (Signed)
My ankle is sore at times.  She has been using the CAM walker on the right.  She has no new trauma.  She has no redness or swelling.  NV intact. ROM is full.  X-rays were done of the right ankle, reported separately.  Encounter Diagnosis  Name Primary?  . Pain in right ankle and joints of right foot Yes   Come out of the CAM walker, wean out of it.  Return in one month.  X-rays then.  Call if any problem.  Precautions discussed.   Electronically Signed Darreld Mclean, MD 3/1/20228:31 AM

## 2020-12-01 ENCOUNTER — Other Ambulatory Visit: Payer: Self-pay

## 2020-12-01 ENCOUNTER — Ambulatory Visit (INDEPENDENT_AMBULATORY_CARE_PROVIDER_SITE_OTHER): Payer: Medicaid Other | Admitting: Orthopaedic Surgery

## 2020-12-01 ENCOUNTER — Encounter: Payer: Self-pay | Admitting: Orthopaedic Surgery

## 2020-12-01 ENCOUNTER — Ambulatory Visit: Payer: Medicaid Other

## 2020-12-01 VITALS — BP 153/85 | HR 73 | Ht 67.0 in | Wt 208.0 lb

## 2020-12-01 DIAGNOSIS — S8264XD Nondisplaced fracture of lateral malleolus of right fibula, subsequent encounter for closed fracture with routine healing: Secondary | ICD-10-CM

## 2020-12-01 NOTE — Progress Notes (Signed)
My ankle is a little stiff at times.  She is doing well. She has some stiffness at times but is walking well and has no other pain.  NV intact.  X-rays were done of the right ankle, reported separately.  Encounter Diagnosis  Name Primary?  . Closed nondisplaced fracture of lateral malleolus of right fibula with routine healing, subsequent encounter Yes   I will discharge her today.  Return if any problem.  Call if any problem.  Precautions discussed.   Electronically Signed Darreld Mclean, MD 3/29/20228:52 AM

## 2021-12-31 ENCOUNTER — Ambulatory Visit
Admission: EM | Admit: 2021-12-31 | Discharge: 2021-12-31 | Disposition: A | Discharge status: Home / Self Care | Payer: Medicaid Other | Attending: Nurse Practitioner | Admitting: Nurse Practitioner

## 2021-12-31 ENCOUNTER — Other Ambulatory Visit: Payer: Self-pay

## 2021-12-31 ENCOUNTER — Encounter: Payer: Self-pay | Admitting: Emergency Medicine

## 2021-12-31 DIAGNOSIS — R399 Unspecified symptoms and signs involving the genitourinary system: Secondary | ICD-10-CM | POA: Insufficient documentation

## 2021-12-31 DIAGNOSIS — R350 Frequency of micturition: Secondary | ICD-10-CM | POA: Insufficient documentation

## 2021-12-31 DIAGNOSIS — M5431 Sciatica, right side: Secondary | ICD-10-CM | POA: Diagnosis present

## 2021-12-31 LAB — POCT URINALYSIS DIP (MANUAL ENTRY)
Bilirubin, UA: NEGATIVE
Glucose, UA: NEGATIVE mg/dL
Ketones, POC UA: NEGATIVE mg/dL
Nitrite, UA: NEGATIVE
Protein Ur, POC: NEGATIVE mg/dL
Spec Grav, UA: 1.02 (ref 1.010–1.025)
Urobilinogen, UA: 0.2 E.U./dL
pH, UA: 7 (ref 5.0–8.0)

## 2021-12-31 MED ORDER — NITROFURANTOIN MONOHYD MACRO 100 MG PO CAPS: 100.0000 mg | ORAL_CAPSULE | Freq: Two times a day (BID) | ORAL | 0 refills | Status: AC

## 2021-12-31 MED ORDER — PREDNISONE 10 MG (21) PO TBPK: ORAL_TABLET | Freq: Every day | ORAL | 0 refills | Status: AC

## 2021-12-31 NOTE — Discharge Instructions (Addendum)
Your urinalysis shows that you may have a possible urinary tract infection.  I am starting you on medication today.  A urine culture has also been ordered.  If that result is negative, you will be contacted and asked to stop the medication. ?Apply heat to the right side of your back.  Apply for 20 minutes, remove for 1 hour, then repeat. ?Try to stay as active as possible. ?While you are taking the prednisone, you can take Tylenol extra strength 500 mg 1 to 2 tablets every 8 hours as needed for any pain. ?Increase fluids and get plenty of rest.  You should be drinking at least 8 ounces of water daily while symptoms persist. ?We should be urinating every 2 hours. ?If you are sexually active, urinate approximately 15 to 20 minutes after any sexual intercourse. ?May take ibuprofen or Tylenol for pain, fever, or general discomfort. ?Follow-up if you develop fever, chills, abdominal pain, worsening back pain, or other concerns. ? ?

## 2021-12-31 NOTE — ED Provider Notes (Signed)
?[facility] ? ? ? ?CSN: [id number] ?Arrival date & time: 12/31/21  0807 ? ? ?  ? ?History   ?Chief Complaint ?Chief Complaint  ?Patient presents with  ? Back Pain  ? ? ?HPI ?Joy Harrington is a 41 y.o. female.  ? ?The patient is a 42 year old female who presents with right-sided low back pain.  Symptoms started approximately 2 days ago.  Patient complains of spasm and pain that radiates into the left buttock.  She does state she has numbness in the right lower leg.  She also states that she has tingling.  Denies fever, chills, abdominal pain, change in bowel pattern, weakness, injury, or trauma.  She denies any problems with ambulation.  She does state that she has also had some urinary frequency, but no other urinary symptoms at this time.  Patient ports that she does have a history of spasms. ? ?The history is provided by the patient.  ? ?Past Medical History:  ?Diagnosis Date  ? Osteoarthritis   ? ? ?There are no problems to display for this patient. ? ? ?Past Surgical History:  ?Procedure Laterality Date  ? lap btl  2012  ? TUBAL LIGATION    ? ? ?OB History   ? ? Gravida  ?4  ? Para  ?3  ? Term  ?   ? Preterm  ?   ? AB  ?1  ? Living  ?3  ?  ? ? SAB  ?1  ? IAB  ?   ? Ectopic  ?   ? Multiple  ?   ? Live Births  ?   ?   ?  ?  ? ? ? ?Home Medications   ? ?Prior to Admission medications   ?Medication Sig Start Date End Date Taking? Authorizing Provider  ?nitrofurantoin, macrocrystal-monohydrate, (MACROBID) 100 MG capsule Take 1 capsule (100 mg total) by mouth 2 (two) times daily for 5 days. 12/31/21 01/05/22 Yes Maalik Pinn-Warren, Sadie Haber, NP  ?predniSONE (STERAPRED UNI-PAK 21 TAB) 10 MG (21) TBPK tablet Take by mouth daily for 6 days. Take 6 tablets with breakfast on day 1.  Take 5 tablets with breakfast on day 2.  Take 4 tablets with breakfast on day 3.  Take 3 tablets with breakfast on day 4.  Take 2 tablets with breakfast on day 5.  Take 1 tablet with breakfast on day 6. 12/31/21 01/06/22 Yes Triniti Gruetzmacher-Warren,  Sadie Haber, NP  ? ? ?Family History ?Family History  ?Problem Relation Age of Onset  ? Hypertension Father   ? Diabetes Father   ? Hypertension Mother   ? Diabetes Mother   ? Arthritis Maternal Grandmother   ? Post-traumatic stress disorder Brother   ? ADD / ADHD Son   ? ADD / ADHD Son   ? Autism Son   ? ADD / ADHD Daughter   ? ? ?Social History ?Social History  ? ?Tobacco Use  ? Smoking status: Former  ?  Types: Cigarettes  ?  Quit date: 05/31/2007  ?  Years since quitting: 14.5  ? Smokeless tobacco: Never  ?Vaping Use  ? Vaping Use: Never used  ?Substance Use Topics  ? Alcohol use: No  ? Drug use: No  ? ? ? ?Allergies   ?Patient has no known allergies. ? ? ?Review of Systems ?Review of Systems  ?Respiratory: Negative.    ? ? ?Physical Exam ?Triage Vital Signs ?ED Triage Vitals  ?Enc Vitals Group  ?   BP 12/31/21 0818 130/83  ?  Pulse Rate 12/31/21 0818 85  ?   Resp 12/31/21 0818 18  ?   Temp 12/31/21 0818 98.2 ?F (36.8 ?C)  ?   Temp Source 12/31/21 0818 Oral  ?   SpO2 12/31/21 0818 99 %  ?   Weight 12/31/21 0823 170 lb (77.1 kg)  ?   Height 12/31/21 0823 5\' 7"  (1.702 m)  ?   Head Circumference --   ?   Peak Flow --   ?   Pain Score 12/31/21 0823 10  ?   Pain Loc --   ?   Pain Edu? --   ?   Excl. in GC? --   ? ?No data found. ? ?Updated Vital Signs ?BP 130/83 (BP Location: Right Arm)   Pulse 85   Temp 98.2 ?F (36.8 ?C) (Oral)   Resp 18   Ht 5\' 7"  (1.702 m)   Wt 170 lb (77.1 kg)   LMP 12/17/2021 (Approximate)   SpO2 99%   BMI 26.63 kg/m?  ? ?Visual Acuity ?Right Eye Distance:   ?Left Eye Distance:   ?Bilateral Distance:   ? ?Right Eye Near:   ?Left Eye Near:    ?Bilateral Near:    ? ?Physical Exam ?Vitals reviewed.  ?Constitutional:   ?   Appearance: Normal appearance.  ?HENT:  ?   Head: Normocephalic.  ?Eyes:  ?   Extraocular Movements: Extraocular movements intact.  ?   Pupils: Pupils are equal, round, and reactive to light.  ?Cardiovascular:  ?   Rate and Rhythm: Regular rhythm.  ?Pulmonary:  ?   Effort:  Pulmonary effort is normal.  ?   Breath sounds: Normal breath sounds.  ?Abdominal:  ?   General: Bowel sounds are normal.  ?   Palpations: Abdomen is soft.  ?   Tenderness: There is no abdominal tenderness.  ?Musculoskeletal:  ?   Cervical back: Normal range of motion. No tenderness.  ?   Lumbar back: Spasms (right lumbar spine L5-S1) and tenderness present. Normal range of motion. Negative right straight leg raise test and negative left straight leg raise test.  ?   Comments: Tenderness to right lower back at L5-S1. Spasms noted.   ?Lymphadenopathy:  ?   Cervical: No cervical adenopathy.  ?Skin: ?   General: Skin is warm and dry.  ?Neurological:  ?   Mental Status: She is alert and oriented to person, place, and time.  ?Psychiatric:     ?   Mood and Affect: Mood normal.     ?   Behavior: Behavior normal.  ? ? ? ?UC Treatments / Results  ?Labs ?(all labs ordered are listed, but only abnormal results are displayed) ?Labs Reviewed  ?POCT URINALYSIS DIP (MANUAL ENTRY) - Abnormal; Notable for the following components:  ?    Result Value  ? Blood, UA moderate (*)   ? Leukocytes, UA Moderate (2+) (*)   ? All other components within normal limits  ?URINE CULTURE  ? ? ?EKG ? ? ?Radiology ?No results found. ? ?Procedures ?Procedures (including critical care time) ? ?Medications Ordered in UC ?Medications - No data to display ? ?Initial Impression / Assessment and Plan / UC Course  ?I have reviewed the triage vital signs and the nursing notes. ? ?Pertinent labs & imaging results that were available during my care of the patient were reviewed by me and considered in my medical decision making (see chart for details). ? ?The patient is a 41 year old female who presents with right-sided back  pain.  Patient states symptoms started approximately 2 days ago.  She also complains of spasms.  On exam, she also complains of urinary frequency.  Urinalysis was performed which showed moderate leukocytes.  Her exam also showed moderate  tenderness in the sciatic nerve on her right side.  Patient also complains of numbness and tingling in the right lower extremity.  Symptoms are consistent with a right side sided sciatica as well as a urinary tract infection.  We will start the patient on Macrobid for her urinary symptoms, a urine culture has been performed.  Patient was advised that she will be contacted if her culture is negative and asked to stop the medication.  With regard to her sciatica, we will start the patient on a prednisone taper.  Patient encouraged to use ice to the affected areas for pain, heat for spasm or stiffness.  Encouraged regular activity as much as tolerated.  Return precautions were provided. ?Final Clinical Impressions(s) / UC Diagnoses  ? ?Final diagnoses:  ?Sciatica of right side  ?Urinary frequency  ?Urinary tract infection symptoms  ? ? ? ?Discharge Instructions   ? ?  ?Your urinalysis shows that you may have a possible urinary tract infection.  I am starting you on medication today.  A urine culture has also been ordered.  If that result is negative, you will be contacted and asked to stop the medication. ?Apply heat to the right side of your back.  Apply for 20 minutes, remove for 1 hour, then repeat. ?Try to stay as active as possible. ?While you are taking the prednisone, you can take Tylenol extra strength 500 mg 1 to 2 tablets every 8 hours as needed for any pain. ?Increase fluids and get plenty of rest.  You should be drinking at least 8 ounces of water daily while symptoms persist. ?We should be urinating every 2 hours. ?If you are sexually active, urinate approximately 15 to 20 minutes after any sexual intercourse. ?May take ibuprofen or Tylenol for pain, fever, or general discomfort. ?Follow-up if you develop fever, chills, abdominal pain, worsening back pain, or other concerns. ? ? ? ? ? ?ED Prescriptions   ? ? Medication Sig Dispense Auth. Provider  ? nitrofurantoin, macrocrystal-monohydrate, (MACROBID) 100  MG capsule Take 1 capsule (100 mg total) by mouth 2 (two) times daily for 5 days. 10 capsule Amiya Escamilla-Warren, Sadie Haberhristie J, NP  ? predniSONE (STERAPRED UNI-PAK 21 TAB) 10 MG (21) TBPK tablet Take by mouth daily for 6 days

## 2021-12-31 NOTE — ED Triage Notes (Signed)
Pt reports right lower back pain and intermittent bilateral leg numbness. Pt denies any gi/gu symptoms or known injury. ?

## 2022-01-02 LAB — URINE CULTURE

## 2022-01-07 ENCOUNTER — Ambulatory Visit
Admission: EM | Admit: 2022-01-07 | Discharge: 2022-01-07 | Disposition: A | Discharge status: Home / Self Care | Payer: Medicaid Other | Attending: Student | Admitting: Student

## 2022-01-07 DIAGNOSIS — R300 Dysuria: Secondary | ICD-10-CM | POA: Diagnosis not present

## 2022-01-07 LAB — POCT URINALYSIS DIP (MANUAL ENTRY)
Bilirubin, UA: NEGATIVE
Blood, UA: NEGATIVE
Glucose, UA: NEGATIVE mg/dL
Ketones, POC UA: NEGATIVE mg/dL
Nitrite, UA: NEGATIVE
Protein Ur, POC: NEGATIVE mg/dL
Spec Grav, UA: 1.015 (ref 1.010–1.025)
Urobilinogen, UA: 0.2 E.U./dL
pH, UA: 7.5 (ref 5.0–8.0)

## 2022-01-07 NOTE — ED Triage Notes (Signed)
Pt here for recollection for urine culture  ?

## 2022-01-09 LAB — URINE CULTURE

## 2022-11-03 ENCOUNTER — Encounter: Payer: Self-pay | Admitting: Radiology

## 2022-12-23 ENCOUNTER — Other Ambulatory Visit (HOSPITAL_COMMUNITY): Payer: Self-pay | Admitting: Nurse Practitioner

## 2022-12-23 DIAGNOSIS — Z1231 Encounter for screening mammogram for malignant neoplasm of breast: Secondary | ICD-10-CM

## 2023-01-03 ENCOUNTER — Other Ambulatory Visit (HOSPITAL_BASED_OUTPATIENT_CLINIC_OR_DEPARTMENT_OTHER): Payer: Self-pay

## 2023-01-03 DIAGNOSIS — R0681 Apnea, not elsewhere classified: Secondary | ICD-10-CM

## 2023-01-09 ENCOUNTER — Ambulatory Visit (HOSPITAL_COMMUNITY): Payer: Medicaid Other

## 2023-01-13 ENCOUNTER — Ambulatory Visit (HOSPITAL_COMMUNITY)
Admission: RE | Admit: 2023-01-13 | Discharge: 2023-01-13 | Disposition: A | Discharge status: Home / Self Care | Payer: Medicaid Other | Source: Ambulatory Visit | Attending: Nurse Practitioner | Admitting: Nurse Practitioner

## 2023-01-13 ENCOUNTER — Encounter (HOSPITAL_COMMUNITY): Payer: Self-pay

## 2023-01-13 DIAGNOSIS — Z1231 Encounter for screening mammogram for malignant neoplasm of breast: Secondary | ICD-10-CM | POA: Diagnosis present

## 2023-02-28 ENCOUNTER — Institutional Professional Consult (permissible substitution): Payer: Medicaid Other | Admitting: Neurology

## 2023-03-14 ENCOUNTER — Ambulatory Visit (INDEPENDENT_AMBULATORY_CARE_PROVIDER_SITE_OTHER): Payer: Medicaid Other | Admitting: Neurology

## 2023-03-14 ENCOUNTER — Encounter: Payer: Self-pay | Admitting: Neurology

## 2023-03-14 VITALS — BP 133/54 | HR 71 | Ht 67.0 in | Wt 197.2 lb

## 2023-03-14 DIAGNOSIS — J3089 Other allergic rhinitis: Secondary | ICD-10-CM

## 2023-03-14 DIAGNOSIS — G4719 Other hypersomnia: Secondary | ICD-10-CM | POA: Insufficient documentation

## 2023-03-14 DIAGNOSIS — R0683 Snoring: Secondary | ICD-10-CM

## 2023-03-14 DIAGNOSIS — R065 Mouth breathing: Secondary | ICD-10-CM | POA: Insufficient documentation

## 2023-03-14 DIAGNOSIS — J309 Allergic rhinitis, unspecified: Secondary | ICD-10-CM | POA: Insufficient documentation

## 2023-03-14 NOTE — Progress Notes (Signed)
SLEEP MEDICINE CLINIC    Provider:  Melvyn Novas, MD  Primary Care Physician:  Miguel Rota, MD [address] [place] [address]     Referring Provider: [facility] medical-   Shanna Cisco, Np [address] [place],  [place] [address]          Chief Complaint according to patient   Patient presents with:     New Patient (Initial Visit)           HISTORY OF PRESENT ILLNESS:  Joy Harrington is a 42 y.o. female patient who is seen upon referral on 03/14/2023 from PCP Jari Favre, MD,  for a sleep medicine evaluation .  Chief concern according to patient :  " I snore and choke, I have heart burn, my partner witnessed apnea"     The patient had no previous sleep study.    Sleep relevant medical history: she is waking up unrefreshed in AM,  sometimes with headaches.  Dry mouth , Nocturia 2-4 times, Sleep talking, no ENT surgeries, wisdom teeth were extracted, never wore braces, no TBI, no whiplash, no thyroid disease.     Family medical /sleep history: No other family member on CPAP with OSA. brother with insomnia.    Social history:  Patient is working as a Conservation officer, nature at [employer], standing on Animator all day-  and lives in a household with a partner and her 3 children( 62, 57  and 32  years -old) ..one dog. Family status is single. The patient currently works early shift, 7 Am start.  Tobacco use: none .  ETOH use ; weekends  one or 2 in 3 months.   Caffeine intake in form of Coffee( 16 ounce mug in AM ) Soda( /) Tea ( iced tea, 2 glasses a day and lunch & dinner at 7.30. or energy drinks Exercise in form of walking.       Sleep habits are as follows: The patient's dinner time is between 6-7.30 PM. The patient goes to bed at 10 PM and continues to sleep for 6-7 hours, wakes for many bathroom breaks, the first time at midnight AM.  Bedroom is cool, quiet and dark.  The preferred sleep position is prone, with the support of 01 pillows.  Dreams are reportedly  frequent.   The patient wakes up spontaneously before the alarm. 6.30  AM is the usual rise time. She reports not feeling refreshed or restored in AM, with symptoms such as dry mouth, morning headaches, and residual fatigue.  Naps are taken  after lunch frequently, lasting from 15 to 20 minutes , she goes to her car or home.   Review of Systems: Out of a complete 14 system review, the patient complains of only the following symptoms, and all other reviewed systems are negative.:  Fatigue, sleepiness , snoring, fragmented sleep, Nocturia   Morning headaches, rarely woken up by HA.  Dry mouth.   Isolated sleep paralysis.   Vivid dreams, but not nightly.   Power nap taker.    How likely are you to doze in the following situations: 0 = not likely, 1 = slight chance, 2 = moderate chance, 3 = high chance   Sitting and Reading? Watching Television? Sitting inactive in a public place (theater or meeting)? As a passenger in a car for an hour without a break? Lying down in the afternoon when circumstances permit? Sitting and talking to someone? Sitting quietly after lunch without alcohol? In  a car, while stopped for a few minutes in traffic?   Total = 12/ 24 points   FSS endorsed at 25/ 63 points.   Social History   Socioeconomic History   Marital status: Single    Spouse name: Not on file   Number of children: Not on file   Years of education: Not on file   Highest education level: Not on file  Occupational History   Not on file  Tobacco Use   Smoking status: Former    Types: Cigarettes    Quit date: 05/31/2007    Years since quitting: 15.7   Smokeless tobacco: Never  Vaping Use   Vaping Use: Never used  Substance and Sexual Activity   Alcohol use: No   Drug use: No   Sexual activity: Not Currently    Partners: Male    Birth control/protection: Surgical    Comment: tubal ligation  Other Topics Concern   Not on file  Social History Narrative   Not on file   Social  Determinants of Health   Financial Resource Strain: Medium Risk (11/02/2020)   Overall Financial Resource Strain (CARDIA)    Difficulty of Paying Living Expenses: Somewhat hard  Food Insecurity: Food Insecurity Present (11/02/2020)   Hunger Vital Sign    Worried About Running Out of Food in the Last Year: Sometimes true    Ran Out of Food in the Last Year: Sometimes true  Transportation Needs: No Transportation Needs (11/02/2020)   PRAPARE - Administrator, Civil Service (Medical): No    Lack of Transportation (Non-Medical): No  Physical Activity: Insufficiently Active (11/02/2020)   Exercise Vital Sign    Days of Exercise per Week: 3 days    Minutes of Exercise per Session: 30 min  Stress: Stress Concern Present (11/02/2020)   [employer] of Occupational Health - Occupational Stress Questionnaire    Feeling of Stress : To some extent  Social Connections: Moderately Isolated (11/02/2020)   Social Connection and Isolation Panel [NHANES]    Frequency of Communication with Friends and Family: More than three times a week    Frequency of Social Gatherings with Friends and Family: Once a week    Attends Religious Services: More than 4 times per year    Active Member of [employer] or Organizations: No    Attends Banker Meetings: Never    Marital Status: Never married    Family History  Problem Relation Age of Onset   Hypertension Father    Diabetes Father    Hypertension Mother    Diabetes Mother    Arthritis Maternal Grandmother    Post-traumatic stress disorder Brother    ADD / ADHD Son    ADD / ADHD Son    Autism Son    ADD / ADHD Daughter     Past Medical History:  Diagnosis Date   Osteoarthritis     Past Surgical History:  Procedure Laterality Date   lap btl  2012   TUBAL LIGATION       Current Outpatient Medications on File Prior to Visit  Medication Sig Dispense Refill   cetirizine (ZYRTEC) 10 MG tablet Take 1 tablet by mouth daily.      No current facility-administered medications on file prior to visit.    Allergies  Allergen Reactions   Strawberry Extract Rash     DIAGNOSTIC DATA (LABS, IMAGING, TESTING) - I reviewed patient records, labs, notes, testing and imaging myself where available.  Lab Results  Component Value Date   WBC 6.1 05/14/2007   HGB 12.1 05/14/2007   HCT 37.3 05/14/2007   MCV 82.4 05/14/2007   PLT 254 05/14/2007   No results found for: "NA", "K", "CL", "CO2", "GLUCOSE", "BUN", "CREATININE", "CALCIUM", "PROT", "ALBUMIN", "AST", "ALT", "ALKPHOS", "BILITOT", "GFRNONAA", "GFRAA" No results found for: "CHOL", "HDL", "LDLCALC", "LDLDIRECT", "TRIG", "CHOLHDL" No results found for: "HGBA1C" No results found for: "VITAMINB12" No results found for: "TSH"  PHYSICAL EXAM:  Today's Vitals   03/14/23 1031  BP: (!) 133/54  Pulse: 71  Weight: 197 lb 3.2 oz (89.4 kg)  Height: 5\' 7"  (1.702 m)   Body mass index is 30.89 kg/m.   Wt Readings from Last 3 Encounters:  03/14/23 197 lb 3.2 oz (89.4 kg)  12/31/21 170 lb (77.1 kg)  12/01/20 208 lb (94.3 kg)     Ht Readings from Last 3 Encounters:  03/14/23 5\' 7"  (1.702 m)  12/31/21 5\' 7"  (1.702 m)  12/01/20 5\' 7"  (1.702 m)      General: The patient is awake, alert and appears not in acute distress. The patient is well groomed. Head: Normocephalic, atraumatic. Neck is supple.  Mallampati 1-2,  neck circumference:14 inches . Nasal airflow via left nostril not  patent.   Retrognathia is not seen.  Rather a protruding lower jaw.  Dental status: biolgical Cardiovascular:  Regular rate and cardiac rhythm by pulse,  without distended neck veins. Respiratory: Lungs are clear to auscultation.  Skin:  Without evidence of ankle edema, or rash. Trunk: The patient's posture is erect.   NEUROLOGIC EXAM: The patient is awake and alert, oriented to place and time.   Memory subjective described as intact.  Attention span & concentration ability appears  normal.  Speech is fluent,  without  dysarthria, but with hoarseness/ dysphonia . Mood and affect are appropriate.   Cranial nerves: no loss of smell or taste reported  Pupils are equal and briskly reactive to light. Funduscopic exam deferred..  Extraocular movements in vertical and horizontal planes were intact and without nystagmus. No Diplopia. Visual fields by finger perimetry are intact. Hearing was intact to soft voice and finger rubbing.    Facial sensation intact to fine touch.  Facial motor strength is symmetric and tongue and uvula move midline.  Neck ROM : rotation, tilt and flexion extension were normal for age and shoulder shrug was symmetrical.    Motor exam:  Symmetric bulk, tone and ROM.   Normal tone without cog- wheeling, symmetric grip strength .   Sensory:  Fine touch and vibration were normal.  Proprioception tested in the upper extremities was normal.   Coordination: Rapid alternating movements in the fingers/hands were of normal speed.  The Finger-to-nose maneuver was intact without evidence of ataxia, dysmetria or tremor.   Gait and station: Patient could rise unassisted from a seated position, walked without assistive device.  Stance is of normal width/ base and the patient turned with 3 steps.  Toe and heel walk were deferred.  Deep tendon reflexes: in the  upper and lower extremities are symmetric and intact.  Babinski response was deferred .     ASSESSMENT AND PLAN 42 y.o.  AA female  here with:    1) witnessed apnea, snoring and  reported nocturia, morning headaches and need for naps, EDS more than fatigue.  Takes power naps.   Suspected OSA - even with low grade Mallampati and small neck, this patient has been witnessed to have apnea.  She has GERD, dependent  on food - avoiding green pepper, onions. Not needing to sleep propped up. Nocturia can be related to late caffeine intake in form of tea, iced tea.     2) we plan on an a screening test for  apnea by HST.    3) Rv in 3-5 months with NP or me for results review and therapy review.   I plan to follow up either personally or through our NP within 3-5 months.   I would like to thank Miguel Rota, MD at [facility], [facility]. and Shanna Cisco, Np [address] [place],  [place] [address] for allowing me to meet with and to take care of this pleasant patient.     After spending a total time of  40  minutes face to face and additional time for physical and neurologic examination, review of laboratory studies,  personal review of imaging studies, reports and results of other testing and review of referral information / records as far as provided in visit,   Electronically signed by: Melvyn Novas, MD 03/14/2023 11:01 AM  [facility] and [facility] Board certified by The [employer] of Sleep Medicine and Diplomate of the [employer] of Sleep Medicine. Board certified In Neurology through the ABPN, Fellow of the [employer] of Neurology.

## 2023-03-14 NOTE — Patient Instructions (Signed)
ASSESSMENT AND PLAN 42 y.o.  AA female  here with:    1) witnessed apnea, snoring and  reported nocturia, morning headaches and need for naps, EDS more than fatigue.  Takes power naps.   Suspected OSA - even with low grade Mallampati and small neck, this patient has been witnessed to have apnea.  She has GERD, dependent on food - avoiding green pepper, onions. Not needing to sleep propped up. Nocturia can be related to late caffeine intake in form of tea, iced tea.     2) we plan on an a screening test for apnea by HST.    3) Rv in 3-5 months with NP or me for results review and therapy review.   I plan to follow up either personally or through our NP within 3-5 months.   I would like to thank Miguel Rota, MD at [facility], [facility]. and Shanna Cisco, Np [address] [place],  [place] [address] for allowing me to meet with and to take care of this pleasant patient.     Screening for Sleep Apnea  Sleep apnea is a condition in which breathing pauses or becomes shallow during sleep. Sleep apnea screening is a test to determine if you are at risk for sleep apnea. The test includes a series of questions. It will only takes a few minutes. Your health care provider may ask you to have this test in preparation for surgery or as part of a physical exam. What are the symptoms of sleep apnea? Common symptoms of sleep apnea include: Snoring. Waking up often at night. Daytime sleepiness. Pauses in breathing. Choking or gasping during sleep. Irritability. Forgetfulness. Trouble thinking clearly. Depression. Personality changes. Most people with sleep apnea do not know that they have it. What are the advantages of sleep apnea screening? Getting screened for sleep apnea can help: Ensure your safety. It is important for your health care providers to know whether or not you have sleep apnea, especially if you are having surgery or have other long-term (chronic) health  conditions. Improve your health and allow you to get a better night's rest. Restful sleep can help you: Have more energy. Lose weight. Improve high blood pressure. Improve diabetes management. Prevent stroke. Prevent car accidents. What happens during the screening? Screening usually includes being asked a list of questions about your sleep quality. Some questions you may be asked include: Do you snore? Is your sleep restless? Do you have daytime sleepiness? Has a partner or spouse told you that you stop breathing during sleep? Have you had trouble concentrating or memory loss? What is your age? What is your neck circumference? To measure your neck, keep your back straight and gently wrap the tape measure around your neck. Put the tape measure at the middle of your neck, between your chin and collarbone. What is your sex assigned at birth? Do you have or are you being treated for high blood pressure? If your screening test is positive, you are at risk for the condition. Further testing may be needed to confirm a diagnosis of sleep apnea. Where to find more information You can find screening tools online or at your health care clinic. For more information about sleep apnea screening and healthy sleep, visit these websites: Centers for Disease Control and Prevention: [employer]Ex[employer]com.br American Sleep Apnea Association: www.sleepapnea.org Contact a health care provider if: You think that you may have sleep apnea. Summary Sleep apnea screening can help determine if you are at risk  for sleep apnea. It is important for your health care providers to know whether or not you have sleep apnea, especially if you are having surgery or have other chronic health conditions. You may be asked to take a screening test for sleep apnea in preparation for surgery or as part of a physical exam. This information is not intended to replace advice given to you by your health care provider. Make sure you discuss any  questions you have with your health care provider. Document Revised: 07/31/2020 Document Reviewed: 07/31/2020 Elsevier Patient Education  2024 [employer].

## 2023-03-22 ENCOUNTER — Telehealth: Payer: Self-pay | Admitting: Neurology

## 2023-03-22 NOTE — Telephone Encounter (Signed)
[facility] pending uploaded notes.  

## 2023-04-03 NOTE — Telephone Encounter (Signed)
HST- [facility] no auth req via fax form

## 2023-04-18 ENCOUNTER — Ambulatory Visit: Payer: Medicaid Other | Admitting: Neurology

## 2023-04-18 DIAGNOSIS — R065 Mouth breathing: Secondary | ICD-10-CM

## 2023-04-18 DIAGNOSIS — J3089 Other allergic rhinitis: Secondary | ICD-10-CM

## 2023-04-18 DIAGNOSIS — R0683 Snoring: Secondary | ICD-10-CM

## 2023-04-18 DIAGNOSIS — G4710 Hypersomnia, unspecified: Secondary | ICD-10-CM | POA: Diagnosis not present

## 2023-04-18 DIAGNOSIS — G4719 Other hypersomnia: Secondary | ICD-10-CM

## 2023-04-19 NOTE — Progress Notes (Signed)
[facility]  Sharlotte Alamo 42 year old female   HOME SLEEP TEST REPORT ( by Watch PAT)   STUDY DATE:  04-19-2023   ORDERING CLINICIAN: Melvyn Novas, MD  REFERRING CLINICIAN:  Shanna Cisco, NP & Dr Irven Easterly, MD , [facility]    CLINICAL INFORMATION/HISTORY: 03-14-2023: Joy Harrington is a 42 y.o. female patient who is seen upon referral on 03/14/2023 from PCP Jari Favre, MD,  for a sleep medicine evaluation .  Chief concern according to patient :  " I snore and choke, I have heart burn, my partner witnessed apnea" . The patient had no previous sleep study.    Sleep relevant ROS and medical history: She is waking up unrefreshed in AM,  sometimes with headaches.  Dry mouth , Nocturia 2-4 times, Sleep talking. There was no History of ENT surgeries, but wisdom teeth were extracted, she never wore braces, had no TBI, no whiplash, no thyroid disease. Witnessed apnea, snoring and reported nocturia, morning headaches and need for naps, EDS more than fatigue. Takes power naps.     Epworth sleepiness score: 12/ 24 points   FSS endorsed at 25/ 63 points.    BMI: 31 kg/m   Neck Circumference: 14"   FINDINGS:   Sleep Summary:   Total Recording Time (hours, min): 9 hours 33 minutes       Total Sleep Time (hours, min): 8 hours 14 minutes               Percent REM (%): 32.1%       Sleep latency was 21 minutes, REM sleep latency 49 minutes and there was provide 57 minutes of wakefulness after sleep onset.                                 Respiratory Indices:   Calculated pAHI (per hour):   6.1/h by AASM criteria, 2.4/h by CMS criteria                          REM pAHI: 10/h                                                NREM pAHI: 4.5/h                             Positional AHI: The patient slept for the majority of the night in the night in supine position for 246 minutes associated with an AHI by AASM criteria of 8.5 per h, nonsupine sleep was associated  with an AHI of 3.6/h.  Snoring reached a mean volume of 41 dB and was present for a quarter of a quarter of total recorded sleep time.                                                 Oxygen Saturation Statistics:  O2 Saturation Range (%): Between a nadir of 92 and a maximum saturation of 100 with a mean saturation of 96%  O2 Saturation (minutes) <89%:   0 minutes        Pulse Rate Statistics:   Pulse Mean (bpm):   65 bpm              Pulse Range:    Between 53 and 98 bpm.  Please note that no cardiac rhythm information can be extracted .             IMPRESSION:  This HST confirms the presence of very mild obstructive sleep apnea and would not require treatment unless the patient has EDS -. It is unlikely that sleep apnea with an AHI of 6.1/h is causing hypersomnia.    RECOMMENDATION: 1) Avoiding supine sleep position position.  This alone would reduce the apnea hypopnea index to below 5/h. #2 )improvement of nasal airway passage and patency.  #3 regular exercise and weight loss to a BMI of 28 or less.  If the patient chooses these interventions, she would not have to follow-up with me.  4)CPAP can be offered should the above recommended changes not work or if these cannot be implemented.  If the patient wishes to proceed to CPAP treatment I would I would order an autotitration CPAP with a setting between 5 and 12 cm water pressure, heated humidification, mask of choice and an expiratory relief setting of 2 cm water pressure.      INTERPRETING PHYSICIAN:   Melvyn Novas, MD

## 2023-05-01 NOTE — Procedures (Signed)
[facility]  Joy Harrington 42 year old female   HOME SLEEP TEST REPORT ( by Watch PAT)   STUDY DATE:  04-19-2023   ORDERING CLINICIAN: Melvyn Novas, MD  REFERRING CLINICIAN:  Shanna Cisco, NP & Dr Irven Easterly, MD , [facility]    CLINICAL INFORMATION/HISTORY: 03-14-2023: Joy Harrington is a 42 y.o. female patient who is seen upon referral on 03/14/2023 from PCP Jari Favre, MD,  for a sleep medicine evaluation .  Chief concern according to patient :  " I snore and choke, I have heart burn, my partner witnessed apnea" . The patient had no previous sleep study.    Sleep relevant ROS and medical history: She is waking up unrefreshed in AM,  sometimes with headaches.  Dry mouth , Nocturia 2-4 times, Sleep talking. There was no History of ENT surgeries, but wisdom teeth were extracted, she never wore braces, had no TBI, no whiplash, no thyroid disease. Witnessed apnea, snoring and reported nocturia, morning headaches and need for naps, EDS more than fatigue. Takes power naps.     Epworth sleepiness score: 12/ 24 points   FSS endorsed at 25/ 63 points.    BMI: 31 kg/m   Neck Circumference: 14"   FINDINGS:   Sleep Summary:   Total Recording Time (hours, min): 9 hours 33 minutes       Total Sleep Time (hours, min): 8 hours 14 minutes               Percent REM (%): 32.1%       Sleep latency was 21 minutes, REM sleep latency 49 minutes and there was provide 57 minutes of wakefulness after sleep onset.                                 Respiratory Indices:   Calculated pAHI (per hour):   6.1/h by AASM criteria, 2.4/h by CMS criteria                          REM pAHI: 10/h                                                NREM pAHI: 4.5/h                             Positional AHI: The patient slept for the majority of the night in the night in supine position for 246 minutes associated with an AHI by AASM criteria of 8.5 per h, nonsupine sleep was associated with an AHI of  3.6/h.  Snoring reached a mean volume of 41 dB and was present for a quarter of a quarter of total recorded sleep time.                                                 Oxygen Saturation Statistics:  O2 Saturation Range (%): Between a nadir of 92 and a maximum saturation of 100 with a mean saturation of 96%  O2 Saturation (minutes) <89%:   0 minutes        Pulse Rate Statistics:   Pulse Mean (bpm):   65 bpm              Pulse Range:    Between 53 and 98 bpm.  Please note that no cardiac rhythm information can be extracted .             IMPRESSION:  This HST confirms the presence of very mild obstructive sleep apnea and would not require treatment unless the patient has EDS -. It is unlikely that sleep apnea with an AHI of 6.1/h is causing hypersomnia.    RECOMMENDATION: 1) Avoiding supine sleep position position.  This alone would reduce the apnea hypopnea index to below 5/h. #2 )improvement of nasal airway passage and patency.  #3 regular exercise and weight loss to a BMI of 28 or less.  If the patient chooses these interventions, she would not have to follow-up with me.  4)CPAP can be offered should the above recommended changes not work or if these cannot be implemented.  If the patient wishes to proceed to CPAP treatment I would I would order an autotitration CPAP with a setting between 5 and 12 cm water pressure, heated humidification, mask of choice and an expiratory relief setting of 2 cm water pressure.      INTERPRETING PHYSICIAN:   Melvyn Novas, MD

## 2023-07-10 ENCOUNTER — Other Ambulatory Visit (HOSPITAL_COMMUNITY): Payer: Self-pay | Admitting: Radiology

## 2023-07-10 DIAGNOSIS — R0600 Dyspnea, unspecified: Secondary | ICD-10-CM

## 2023-07-31 ENCOUNTER — Ambulatory Visit (HOSPITAL_COMMUNITY)
Admission: RE | Admit: 2023-07-31 | Discharge: 2023-07-31 | Disposition: A | Discharge status: Home / Self Care | Payer: Medicaid Other | Source: Ambulatory Visit | Attending: Registered Nurse | Admitting: Registered Nurse

## 2023-07-31 DIAGNOSIS — R0600 Dyspnea, unspecified: Secondary | ICD-10-CM | POA: Diagnosis present

## 2023-07-31 LAB — PULMONARY FUNCTION TEST
DL/VA % pred: 114 %
DL/VA: 4.95 ml/min/mmHg/L
DLCO unc % pred: 111 %
DLCO unc: 26.76 ml/min/mmHg
FEF 25-75 Post: 2.24 L/s
FEF 25-75 Pre: 2.05 L/s
FEF2575-%Change-Post: 9 %
FEF2575-%Pred-Post: 69 %
FEF2575-%Pred-Pre: 63 %
FEV1-%Change-Post: 2 %
FEV1-%Pred-Post: 93 %
FEV1-%Pred-Pre: 90 %
FEV1-Post: 3.05 L
FEV1-Pre: 2.97 L
FEV1FVC-%Change-Post: 2 %
FEV1FVC-%Pred-Pre: 87 %
FEV6-%Change-Post: 0 %
FEV6-%Pred-Post: 103 %
FEV6-%Pred-Pre: 103 %
FEV6-Post: 4.11 L
FEV6-Pre: 4.09 L
FEV6FVC-%Change-Post: 0 %
FEV6FVC-%Pred-Post: 100 %
FEV6FVC-%Pred-Pre: 100 %
FVC-%Change-Post: 0 %
FVC-%Pred-Post: 102 %
FVC-%Pred-Pre: 102 %
FVC-Post: 4.15 L
FVC-Pre: 4.16 L
Post FEV1/FVC ratio: 74 %
Post FEV6/FVC ratio: 99 %
Pre FEV1/FVC ratio: 71 %
Pre FEV6/FVC Ratio: 98 %
RV % pred: 85 %
RV: 1.51 L
TLC % pred: 105 %
TLC: 5.82 L

## 2023-07-31 MED ORDER — ALBUTEROL SULFATE (2.5 MG/3ML) 0.083% IN NEBU
2.5000 mg | INHALATION_SOLUTION | Freq: Once | RESPIRATORY_TRACT | Status: AC
  Administered 2023-07-31: 2.5 mg via RESPIRATORY_TRACT

## 2023-08-07 ENCOUNTER — Telehealth: Payer: Self-pay | Admitting: Neurology

## 2023-08-07 DIAGNOSIS — G4733 Obstructive sleep apnea (adult) (pediatric): Secondary | ICD-10-CM

## 2023-08-07 DIAGNOSIS — G4719 Other hypersomnia: Secondary | ICD-10-CM

## 2023-08-07 NOTE — Telephone Encounter (Signed)
Spoke to pt  aplogized for delay of care . Did send orders to [facility] today has urgent set up .Per Pt [facility] states that orders were cancelled by [facility] Don't see an note that states cancellation  Dont see any orders placed Sleep study was read in August 2024 . Did review insurance compliance and made cpap f/u with Jessica,NP 10/2023

## 2023-08-07 NOTE — Telephone Encounter (Signed)
Pt has called to report that a new order needs to be sent to her DME ([facility]) for her to get her CPAP

## 2023-08-14 NOTE — Telephone Encounter (Signed)
Pt reschedule appointment due to have to be in court that day.

## 2023-10-11 ENCOUNTER — Encounter: Payer: Medicaid Other | Admitting: Adult Health

## 2023-10-25 ENCOUNTER — Encounter: Payer: Medicaid Other | Admitting: Adult Health

## 2023-10-26 NOTE — Progress Notes (Unsigned)
 Marland Kitchen

## 2023-10-27 ENCOUNTER — Ambulatory Visit: Payer: Medicaid Other | Admitting: Adult Health

## 2023-10-27 ENCOUNTER — Encounter: Payer: Self-pay | Admitting: Adult Health

## 2023-10-27 VITALS — BP 130/53 | HR 65 | Ht 67.0 in | Wt 203.0 lb

## 2023-10-27 DIAGNOSIS — G4733 Obstructive sleep apnea (adult) (pediatric): Secondary | ICD-10-CM | POA: Diagnosis not present

## 2023-10-31 NOTE — Progress Notes (Signed)
 Marland Kitchen

## 2023-11-13 ENCOUNTER — Ambulatory Visit: Payer: Medicaid Other | Admitting: Obstetrics & Gynecology

## 2023-12-18 ENCOUNTER — Other Ambulatory Visit (HOSPITAL_COMMUNITY): Payer: Self-pay | Admitting: Physician Assistant

## 2023-12-18 DIAGNOSIS — Z1231 Encounter for screening mammogram for malignant neoplasm of breast: Secondary | ICD-10-CM

## 2024-01-15 ENCOUNTER — Encounter (HOSPITAL_COMMUNITY): Payer: Self-pay

## 2024-01-15 ENCOUNTER — Inpatient Hospital Stay (HOSPITAL_COMMUNITY): Admission: RE | Admit: 2024-01-15 | Source: Ambulatory Visit

## 2024-02-26 ENCOUNTER — Ambulatory Visit (HOSPITAL_COMMUNITY)
Admission: RE | Admit: 2024-02-26 | Discharge: 2024-02-26 | Disposition: A | Discharge status: Home / Self Care | Source: Ambulatory Visit | Attending: Physician Assistant | Admitting: Physician Assistant

## 2024-02-26 DIAGNOSIS — Z1231 Encounter for screening mammogram for malignant neoplasm of breast: Secondary | ICD-10-CM | POA: Diagnosis present

## 2024-02-27 ENCOUNTER — Encounter (HOSPITAL_COMMUNITY): Payer: Self-pay | Admitting: Physician Assistant

## 2024-03-13 ENCOUNTER — Encounter (HOSPITAL_COMMUNITY): Payer: Self-pay | Admitting: Physician Assistant

## 2024-03-28 ENCOUNTER — Other Ambulatory Visit (HOSPITAL_COMMUNITY): Payer: Self-pay | Admitting: Physician Assistant

## 2024-03-28 DIAGNOSIS — R928 Other abnormal and inconclusive findings on diagnostic imaging of breast: Secondary | ICD-10-CM

## 2024-04-30 ENCOUNTER — Ambulatory Visit (HOSPITAL_COMMUNITY)
Admission: RE | Admit: 2024-04-30 | Discharge: 2024-04-30 | Disposition: A | Discharge status: Home / Self Care | Source: Ambulatory Visit | Attending: Physician Assistant | Admitting: Physician Assistant

## 2024-04-30 DIAGNOSIS — R928 Other abnormal and inconclusive findings on diagnostic imaging of breast: Secondary | ICD-10-CM

## 2024-06-05 ENCOUNTER — Telehealth: Payer: Self-pay | Admitting: *Deleted
